# Patient Record
Sex: Male | Born: 1965 | Race: White | Hispanic: No | Marital: Single | Smoking: Never smoker
Health system: Southern US, Community
[De-identification: ages and names within clinical notes are randomized; demographics above are authoritative.]

## PROBLEM LIST (undated history)

## (undated) HISTORY — PX: HERNIA REPAIR: SHX51

---

## 2013-03-25 ENCOUNTER — Encounter (HOSPITAL_BASED_OUTPATIENT_CLINIC_OR_DEPARTMENT_OTHER): Payer: Self-pay

## 2013-03-25 ENCOUNTER — Emergency Department (HOSPITAL_BASED_OUTPATIENT_CLINIC_OR_DEPARTMENT_OTHER)
Admission: EM | Admit: 2013-03-25 | Discharge: 2013-03-25 | Disposition: A | Discharge status: Home / Self Care | Payer: Self-pay | Attending: Emergency Medicine | Admitting: Emergency Medicine

## 2013-03-25 ENCOUNTER — Emergency Department (HOSPITAL_BASED_OUTPATIENT_CLINIC_OR_DEPARTMENT_OTHER): Payer: Self-pay

## 2013-03-25 DIAGNOSIS — R0789 Other chest pain: Secondary | ICD-10-CM | POA: Insufficient documentation

## 2013-03-25 DIAGNOSIS — R5383 Other fatigue: Secondary | ICD-10-CM | POA: Insufficient documentation

## 2013-03-25 DIAGNOSIS — R079 Chest pain, unspecified: Secondary | ICD-10-CM

## 2013-03-25 DIAGNOSIS — R5381 Other malaise: Secondary | ICD-10-CM | POA: Insufficient documentation

## 2013-03-25 DIAGNOSIS — R002 Palpitations: Secondary | ICD-10-CM | POA: Insufficient documentation

## 2013-03-25 LAB — CBC
HCT: 44.4 % (ref 39.0–52.0)
Hemoglobin: 15.3 g/dL (ref 13.0–17.0)
MCV: 89.3 fL (ref 78.0–100.0)
RBC: 4.97 MIL/uL (ref 4.22–5.81)
RDW: 12.7 % (ref 11.5–15.5)
WBC: 7.5 10*3/uL (ref 4.0–10.5)

## 2013-03-25 LAB — BASIC METABOLIC PANEL
BUN: 19 mg/dL (ref 6–23)
CO2: 27 mEq/L (ref 19–32)
Chloride: 103 mEq/L (ref 96–112)
Creatinine, Ser: 1 mg/dL (ref 0.50–1.35)
Glucose, Bld: 95 mg/dL (ref 70–99)

## 2013-03-25 MED ORDER — ASPIRIN 81 MG PO CHEW
324.0000 mg | CHEWABLE_TABLET | Freq: Once | ORAL | Status: AC
  Administered 2013-03-25: 324 mg via ORAL
  Filled 2013-03-25: qty 4

## 2013-03-25 NOTE — ED Notes (Signed)
MD at bedside. 

## 2013-03-25 NOTE — ED Notes (Signed)
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  Reports 'tightness' in chest, mostly symptoms now resolved but still with small amount of pain.

## 2013-03-25 NOTE — ED Provider Notes (Signed)
CSN: [id number]     Arrival date & time 03/25/13  1806 History     First MD Initiated Contact with Patient 03/25/13 1901     Chief Complaint  Patient presents with  . Shortness of Breath  . Palpitations   (Consider location/radiation/quality/duration/timing/severity/associated sxs/prior Treatment) Patient is a 47 y.o. male presenting with chest pain. The history is provided by the patient.  Chest Pain Pain location:  Unable to specify Pain quality comment:  Unable to describe Pain radiates to:  Does not radiate Pain severity:  No pain Onset quality:  Sudden Duration:  4 hours Timing:  Constant Progression:  Resolved Chronicity:  New Context comment:  Cutting trees at work Relieved by:  Rest Worsened by:  Exertion Associated symptoms: fatigue and shortness of breath   Associated symptoms: no abdominal pain, no back pain, no fever, no lower extremity edema and no nausea     History reviewed. No pertinent past medical history. History reviewed. No pertinent past surgical history. No family history on file. History  Substance Use Topics  . Smoking status: Never Smoker   . Smokeless tobacco: Not on file  . Alcohol Use: Yes    Review of Systems  Constitutional: Positive for fatigue. Negative for fever.  Respiratory: Positive for shortness of breath.   Cardiovascular: Positive for chest pain. Negative for leg swelling.  Gastrointestinal: Negative for nausea, abdominal pain and diarrhea.  Musculoskeletal: Negative for back pain.  All other systems reviewed and are negative.    Allergies  Review of patient's allergies indicates no known allergies.  Home Medications  No current outpatient prescriptions on file. BP 131/80  Pulse 64  Temp(Src) 98.2 F (36.8 C) (Oral)  Resp 16  Ht 5\' 9"  (1.753 m)  Wt 205 lb (92.987 kg)  BMI 30.26 kg/m2  SpO2 100% Physical Exam  Nursing note and vitals reviewed. Constitutional: He is oriented to person, place, and time. He appears  well-developed and well-nourished.  HENT:  Head: Normocephalic and atraumatic.  Right Ear: External ear normal.  Left Ear: External ear normal.  Nose: Nose normal.  Eyes: Right eye exhibits no discharge. Left eye exhibits no discharge.  Neck: Neck supple.  Cardiovascular: Normal rate, regular rhythm, normal heart sounds and intact distal pulses.   Pulmonary/Chest: Effort normal and breath sounds normal. He exhibits no tenderness.  Abdominal: Soft. There is no tenderness.  Musculoskeletal: He exhibits no edema.  Neurological: He is alert and oriented to person, place, and time.  Skin: Skin is warm and dry.    ED Course   Procedures (including critical care time)  Labs Reviewed  BASIC METABOLIC PANEL - Abnormal; Notable for the following:    GFR calc non Af Amer 87 (*)    All other components within normal limits  CBC  TROPONIN I    Date: 03/25/2013  Rate: 63  Rhythm: normal sinus rhythm  QRS Axis: normal  Intervals: normal  ST/T Wave abnormalities: normal  Conduction Disutrbances:none  Narrative Interpretation: Normal EKG  Old EKG Reviewed: none available   No results found. 1. Chest pain     MDM  47 year old male with no prior medical history presents after having chest pain, palpitations, and shortness of breath while at work. The symptoms improved with rest and resumes when going back to work. He's been symptom-free for several hours. His EKG is benign. His labs are normal. Patient refused a chest x-ray as he is worried about the cost. I tried to discuss reasons for chest x-ray  and situation this am patient still refuses. He understands that the risks of misdiagnoses including, pneumothorax, pneumonia, widened mediastinum. He also does over save for a second troponin were developers protocol for a stress test. I feel that he is low risk however I discussed that his workup is not complete within not wanting to do these tests. He states that he understands the risks of going  home including a heart attack, worsening symptoms or death. He currently has passive make decisions and states he'll return if any symptoms get worse.   Audree Camel, MD 03/26/13 Marlyne Beards

## 2013-04-17 ENCOUNTER — Ambulatory Visit (INDEPENDENT_AMBULATORY_CARE_PROVIDER_SITE_OTHER): Payer: Self-pay | Admitting: General Surgery

## 2013-04-17 ENCOUNTER — Encounter (INDEPENDENT_AMBULATORY_CARE_PROVIDER_SITE_OTHER): Payer: Self-pay | Admitting: General Surgery

## 2013-04-17 ENCOUNTER — Telehealth (INDEPENDENT_AMBULATORY_CARE_PROVIDER_SITE_OTHER): Payer: Self-pay

## 2013-04-17 VITALS — BP 114/74 | HR 71 | Temp 97.0°F | Resp 16 | Ht 65.0 in | Wt 211.2 lb

## 2013-04-17 DIAGNOSIS — K436 Other and unspecified ventral hernia with obstruction, without gangrene: Secondary | ICD-10-CM

## 2013-04-17 DIAGNOSIS — K429 Umbilical hernia without obstruction or gangrene: Secondary | ICD-10-CM

## 2013-04-17 NOTE — Telephone Encounter (Signed)
Called and spoke to [facility] requesting medical records for patient appointment w/Dr. Biagio Quint.  Made Madelyn aware that patient is here in the office for appointment and we need records as soon as possible.  Advised that records will be faxed to [phone] attn: Neysa Bonito

## 2013-04-17 NOTE — Progress Notes (Signed)
Patient ID: David Taylor, male   DOB: 05/29/1966, 47 y.o.   MRN: [id number]  Chief Complaint  Patient presents with  . Abdominal Pain    Evaluate ventral hernia    HPI David Taylor is a 47 y.o. male.  This patient is referred by Dr. Mayford Taylor for evaluation ofa bulge in his abdomen that he noticed about 2-3 weeks ago. He says that he never noticed it prior to this.  He says he does have some discomfort with palpation but otherwise is does not bother him. He does say that it is not reducible and is not sure if this is increased in size. He denies any obstructive symptoms. 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 HPI  No past medical history on file. none No past surgical history on file. none No family history on file. none Social History History  Substance Use Topics  . Smoking status: Never Smoker   . Smokeless tobacco: Not on file  . Alcohol Use: Yes  no smoking, occ. alcohol  No Known Allergies  No current outpatient prescriptions on file.   No current facility-administered medications for this visit.  no meds  Review of Systems Review of Systems All other review of systems negative or noncontributory except as stated in the HPI  Blood pressure 114/74, pulse 71, temperature 97 F (36.1 C), temperature source Temporal, resp. rate 16, height 5\' 5"  (1.651 m), weight 211 lb 3.2 oz (95.8 kg).  Physical Exam Physical Exam Physical Exam  Vitals reviewed. Constitutional: He is oriented to person, place, and time. He appears well-developed and well-nourished. No distress.  HENT:  Head: Normocephalic and atraumatic.  Mouth/Throat: No oropharyngeal exudate.  Eyes: Conjunctivae and EOM are normal. Pupils are equal, round, and reactive to light. Right eye exhibits no discharge. Left eye exhibits no discharge. No scleral icterus.  Neck: Normal range of motion. No tracheal deviation present.  Cardiovascular: Normal  rate, regular rhythm and normal heart sounds.   Pulmonary/Chest: Effort normal and breath sounds normal. No stridor. No respiratory distress. He has no wheezes. He has no rales. He exhibits no tenderness.  Abdominal: Soft. Bowel sounds are normal. He exhibits no distension. There is no tenderness. There is no rebound and no guarding. he has a partially incarcerated epigastric hernia in the upper midline with a bulge just to the left of midline.  He also has a small but reducible umbilical hernia. Musculoskeletal: Normal range of motion. He exhibits no edema and no tenderness.  Neurological: He is alert and oriented to person, place, and time.  Skin: Skin is warm and dry. No rash noted. He is not diaphoretic. No erythema. No pallor.  Psychiatric: He has a normal mood and affect. His behavior is normal. Judgment and thought content normal.     Data Reviewed   Assessment    incarcerated epigastric hernia Reducible umbilical hernia  He has an incarcerated epigastric hernia as well as a small but reducible umbilical hernia on exam. I discussed with him the options of continued observation watchful waiting versus laparoscopic or open repair and he would like to proceed with open ventral and umbilical hernia repair with mesh. I did discuss with him the postoperative recovery and expectations and weight restrictions as well as the risks of the procedure. Discuss with him the risks of infection, bleeding, pain, scarring, recurrence, bowel injury, and persistent pain and symptoms and he expressed understanding  and desired to proceed with open repair of epigastric and umbilical hernias with possible mesh     Plan    We will plan for surgical repair        David Taylor DAVID 04/17/2013, 9:59 AM

## 2013-04-21 ENCOUNTER — Telehealth (INDEPENDENT_AMBULATORY_CARE_PROVIDER_SITE_OTHER): Payer: Self-pay | Admitting: General Surgery

## 2013-04-21 ENCOUNTER — Ambulatory Visit (INDEPENDENT_AMBULATORY_CARE_PROVIDER_SITE_OTHER): Payer: Self-pay | Admitting: Surgery

## 2013-04-21 NOTE — Telephone Encounter (Signed)
Spoke with pt about financial responsibilities pt will call back to schedule placed in pending folder

## 2013-06-05 ENCOUNTER — Telehealth (INDEPENDENT_AMBULATORY_CARE_PROVIDER_SITE_OTHER): Payer: Self-pay | Admitting: General Surgery

## 2013-06-05 NOTE — Telephone Encounter (Signed)
I called him to see how he was doing and to see what the status of scheduling surgery was.  He said that it does not feel any worse and that he is not having any nausea or vomiting and or obstructive symptoms.  It does not sound like he is having any obstruction or strangulation.  He is trying to work it out with his employer but it sounds like they are giving him a hard time.  I explained to him that I will be leaving in December but we would be happy to get him another surgeon to perform repair if he desires.  He will call us back or go to ER if increasing pain.

## 2014-04-24 ENCOUNTER — Emergency Department (HOSPITAL_BASED_OUTPATIENT_CLINIC_OR_DEPARTMENT_OTHER): Payer: Self-pay

## 2014-04-24 ENCOUNTER — Emergency Department (HOSPITAL_BASED_OUTPATIENT_CLINIC_OR_DEPARTMENT_OTHER)
Admission: EM | Admit: 2014-04-24 | Discharge: 2014-04-24 | Disposition: A | Discharge status: Home / Self Care | Payer: Self-pay | Attending: Emergency Medicine | Admitting: Emergency Medicine

## 2014-04-24 ENCOUNTER — Encounter (HOSPITAL_BASED_OUTPATIENT_CLINIC_OR_DEPARTMENT_OTHER): Payer: Self-pay | Admitting: Emergency Medicine

## 2014-04-24 DIAGNOSIS — K5900 Constipation, unspecified: Secondary | ICD-10-CM | POA: Insufficient documentation

## 2014-04-24 DIAGNOSIS — Z79899 Other long term (current) drug therapy: Secondary | ICD-10-CM | POA: Insufficient documentation

## 2014-04-24 DIAGNOSIS — N390 Urinary tract infection, site not specified: Secondary | ICD-10-CM | POA: Insufficient documentation

## 2014-04-24 LAB — URINALYSIS, ROUTINE W REFLEX MICROSCOPIC
GLUCOSE, UA: NEGATIVE mg/dL
Hgb urine dipstick: NEGATIVE
KETONES UR: 40 mg/dL — AB
NITRITE: NEGATIVE
PROTEIN: NEGATIVE mg/dL
Specific Gravity, Urine: 1.023 (ref 1.005–1.030)
UROBILINOGEN UA: 1 mg/dL (ref 0.0–1.0)
pH: 6 (ref 5.0–8.0)

## 2014-04-24 LAB — URINE MICROSCOPIC-ADD ON

## 2014-04-24 MED ORDER — POLYETHYLENE GLYCOL 3350 17 G PO PACK: 17.0000 g | PACK | Freq: Every day | ORAL | Status: DC

## 2014-04-24 MED ORDER — GI COCKTAIL ~~LOC~~
30.0000 mL | Freq: Once | ORAL | Status: AC
  Administered 2014-04-24: 30 mL via ORAL
  Filled 2014-04-24: qty 30

## 2014-04-24 MED ORDER — SULFAMETHOXAZOLE-TRIMETHOPRIM 800-160 MG PO TABS
1.0000 | ORAL_TABLET | Freq: Two times a day (BID) | ORAL | Status: DC
Start: 2014-04-24 — End: 2021-10-24

## 2014-04-24 NOTE — Discharge Instructions (Signed)
Urinary Tract Infection  Urinary tract infections (UTIs) can develop anywhere along your urinary tract. Your urinary tract is your body's drainage system for removing wastes and extra water. Your urinary tract includes two kidneys, two ureters, a bladder, and a urethra. Your kidneys are a pair of bean-shaped organs. Each kidney is about the size of your fist. They are located below your ribs, one on each side of your spine.  CAUSES  Infections are caused by microbes, which are microscopic organisms, including fungi, viruses, and bacteria. These organisms are so small that they can only be seen through a microscope. Bacteria are the microbes that most commonly cause UTIs.  SYMPTOMS   Symptoms of UTIs may vary by age and gender of the patient and by the location of the infection. Symptoms in young women typically include a frequent and intense urge to urinate and a painful, burning feeling in the bladder or urethra during urination. Older women and men are more likely to be tired, shaky, and weak and have muscle aches and abdominal pain. A fever may mean the infection is in your kidneys. Other symptoms of a kidney infection include pain in your back or sides below the ribs, nausea, and vomiting.  DIAGNOSIS  To diagnose a UTI, your caregiver will ask you about your symptoms. Your caregiver also will ask to provide a urine sample. The urine sample will be tested for bacteria and white blood cells. White blood cells are made by your body to help fight infection.  TREATMENT   Typically, UTIs can be treated with medication. Because most UTIs are caused by a bacterial infection, they usually can be treated with the use of antibiotics. The choice of antibiotic and length of treatment depend on your symptoms and the type of bacteria causing your infection.  HOME CARE INSTRUCTIONS  · If you were prescribed antibiotics, take them exactly as your caregiver instructs you. Finish the medication even if you feel better after  you have only taken some of the medication.  · Drink enough water and fluids to keep your urine clear or pale yellow.  · Avoid caffeine, tea, and carbonated beverages. They tend to irritate your bladder.  · Empty your bladder often. Avoid holding urine for long periods of time.  · Empty your bladder before and after sexual intercourse.  · After a bowel movement, women should cleanse from front to back. Use each tissue only once.  SEEK MEDICAL CARE IF:   · You have back pain.  · You develop a fever.  · Your symptoms do not begin to resolve within 3 days.  SEEK IMMEDIATE MEDICAL CARE IF:   · You have severe back pain or lower abdominal pain.  · You develop chills.  · You have nausea or vomiting.  · You have continued burning or discomfort with urination.  MAKE SURE YOU:   · Understand these instructions.  · Will watch your condition.  · Will get help right away if you are not doing well or get worse.  Document Released: 05/10/2005 Document Revised: 01/30/2012 Document Reviewed: 09/08/2011  ExitCare® Patient Information ©2015 [employer]. This information is not intended to replace advice given to you by your health care provider. Make sure you discuss any questions you have with your health care provider.  Constipation  Constipation is when a person has fewer than three bowel movements a week, has difficulty having a bowel movement, or has stools that are dry, hard, or larger than normal. As people grow   older, constipation is more common. If you try to fix constipation with medicines that make you have a bowel movement (laxatives), the problem may get worse. Long-term laxative use may cause the muscles of the colon to become weak. A low-fiber diet, not taking in enough fluids, and taking certain medicines may make constipation worse.   CAUSES   · Certain medicines, such as antidepressants, pain medicine, iron supplements, antacids, and water pills.    · Certain diseases, such as diabetes, irritable bowel syndrome  (IBS), thyroid disease, or depression.    · Not drinking enough water.    · Not eating enough fiber-rich foods.    · Stress or travel.    · Lack of physical activity or exercise.    · Ignoring the urge to have a bowel movement.    · Using laxatives too much.    SIGNS AND SYMPTOMS   · Having fewer than three bowel movements a week.    · Straining to have a bowel movement.    · Having stools that are hard, dry, or larger than normal.    · Feeling full or bloated.    · Pain in the lower abdomen.    · Not feeling relief after having a bowel movement.    DIAGNOSIS   Your health care provider will take a medical history and perform a physical exam. Further testing may be done for severe constipation. Some tests may include:  · A barium enema X-ray to examine your rectum, colon, and, sometimes, your small intestine.    · A sigmoidoscopy to examine your lower colon.    · A colonoscopy to examine your entire colon.  TREATMENT   Treatment will depend on the severity of your constipation and what is causing it. Some dietary treatments include drinking more fluids and eating more fiber-rich foods. Lifestyle treatments may include regular exercise. If these diet and lifestyle recommendations do not help, your health care provider may recommend taking over-the-counter laxative medicines to help you have bowel movements. Prescription medicines may be prescribed if over-the-counter medicines do not work.   HOME CARE INSTRUCTIONS   · Eat foods that have a lot of fiber, such as fruits, vegetables, whole grains, and beans.  · Limit foods high in fat and processed sugars, such as french fries, hamburgers, cookies, candies, and soda.    · A fiber supplement may be added to your diet if you cannot get enough fiber from foods.    · Drink enough fluids to keep your urine clear or pale yellow.    · Exercise regularly or as directed by your health care provider.    · Go to the restroom when you have the urge to go. Do not hold it.    · Only  take over-the-counter or prescription medicines as directed by your health care provider. Do not take other medicines for constipation without talking to your health care provider first.    SEEK IMMEDIATE MEDICAL CARE IF:   · You have bright red blood in your stool.    · Your constipation lasts for more than 4 days or gets worse.    · You have abdominal or rectal pain.    · You have thin, pencil-like stools.    · You have unexplained weight loss.  MAKE SURE YOU:   · Understand these instructions.  · Will watch your condition.  · Will get help right away if you are not doing well or get worse.  Document Released: 04/28/2004 Document Revised: 08/05/2013 Document Reviewed: 05/12/2013  ExitCare® Patient Information ©2015 [employer]. This information is not intended to replace advice given to you by   your health care provider. Make sure you discuss any questions you have with your health care provider.

## 2014-04-24 NOTE — ED Notes (Signed)
Pt returned from xray

## 2014-04-24 NOTE — ED Provider Notes (Signed)
CSN: [id number]     Arrival date & time 04/24/14  4098 History   First MD Initiated Contact with Patient 04/24/14 1022     Chief Complaint  Patient presents with  . Constipation    x 3 days     (Consider location/radiation/quality/duration/timing/severity/associated sxs/prior Treatment) HPI Comments: Patient reports 3 days of constipation not relieved by over-the-counter Dulcolax and Metamucil as well as lower abdominal pain described as feeling like the acid in the stomach is burning: The side of the stomach. He has had some anorexia. He denies fevers other than a subjective fever last night. He denies nausea vomiting, urinary symptoms. He has no past surgical or medical history. He states he thinks he has an abdominal hernia but does not have pain at site of his abdominal pain is constant, burning, without aggravating or alleviating factors.   Patient is a 48 y.o. male presenting with constipation.  Constipation Associated symptoms: abdominal pain   Associated symptoms: no back pain, no diarrhea, no dysuria, no fever, no nausea and no vomiting     History reviewed. No pertinent past medical history. History reviewed. No pertinent past surgical history. History reviewed. No pertinent family history. History  Substance Use Topics  . Smoking status: Never Smoker   . Smokeless tobacco: Not on file  . Alcohol Use: Yes    Review of Systems  Constitutional: Negative for fever, activity change, appetite change and fatigue.  HENT: Negative for congestion, facial swelling, rhinorrhea and trouble swallowing.   Eyes: Negative for photophobia and pain.  Respiratory: Negative for cough, chest tightness and shortness of breath.   Cardiovascular: Negative for chest pain and leg swelling.  Gastrointestinal: Positive for abdominal pain and constipation. Negative for nausea, vomiting and diarrhea.  Endocrine: Negative for polydipsia and polyuria.  Genitourinary: Negative for dysuria, urgency,  decreased urine volume and difficulty urinating.  Musculoskeletal: Negative for back pain and gait problem.  Skin: Negative for color change, rash and wound.  Allergic/Immunologic: Negative for immunocompromised state.  Neurological: Negative for dizziness, facial asymmetry, speech difficulty, weakness, numbness and headaches.  Psychiatric/Behavioral: Negative for confusion, decreased concentration and agitation.      Allergies  Review of patient's allergies indicates no known allergies.  Home Medications   Prior to Admission medications   Medication Sig Start Date End Date Taking? Authorizing Provider  polyethylene glycol (MIRALAX / GLYCOLAX) packet Take 17 g by mouth daily. May take up to 3 capfuls in a large Gatorade over the course of one day. You may due this daily until your stool is loose, then decrease to 1 capful a day for 1 week. 04/24/14   Toy Cookey, MD  sulfamethoxazole-trimethoprim (SEPTRA DS) 800-160 MG per tablet Take 1 tablet by mouth every 12 (twelve) hours. 04/24/14   Toy Cookey, MD   BP 135/86  Pulse 65  Temp(Src) 98.7 F (37.1 C) (Oral)  Resp 18  Ht  (1.753 m)  Wt 205 lb (92.987 kg)  BMI 30.26 kg/m2  SpO2 100% Physical Exam  Constitutional: He is oriented to person, place, and time. He appears well-developed and well-nourished. No distress.  HENT:  Head: Normocephalic and atraumatic.  Mouth/Throat: No oropharyngeal exudate.  Eyes: Pupils are equal, round, and reactive to light.  Neck: Normal range of motion. Neck supple.  Cardiovascular: Normal rate, regular rhythm and normal heart sounds.  Exam reveals no gallop and no friction rub.   No murmur heard. Pulmonary/Chest: Effort normal and breath sounds normal. No respiratory distress. He has no  wheezes. He has no rales.  Abdominal: Soft. Bowel sounds are normal. He exhibits no distension and no mass. There is tenderness in the suprapubic area. There is no rebound and no guarding.  Musculoskeletal:  Normal range of motion. He exhibits no edema and no tenderness.  Neurological: He is alert and oriented to person, place, and time.  Skin: Skin is warm and dry.  Psychiatric: He has a normal mood and affect.    ED Course  Procedures (including critical care time) Labs Review Labs Reviewed  URINALYSIS, ROUTINE W REFLEX MICROSCOPIC - Abnormal; Notable for the following:    Color, Urine AMBER (*)    Bilirubin Urine SMALL (*)    Ketones, ur 40 (*)    Leukocytes, UA TRACE (*)    All other components within normal limits  URINE MICROSCOPIC-ADD ON - Abnormal; Notable for the following:    Bacteria, UA MANY (*)    All other components within normal limits  URINE CULTURE    Imaging Review Dg Abd Acute W/chest  04/24/2014   CLINICAL DATA:  Lower abdominal pain  EXAM: ACUTE ABDOMEN SERIES (ABDOMEN 2 VIEW & CHEST 1 VIEW)  COMPARISON:  None.  FINDINGS: There is no evidence of dilated bowel loops or free intraperitoneal air. No radiopaque calculi or other significant radiographic abnormality is seen. Heart size and mediastinal contours are within normal limits. Both lungs are clear.  IMPRESSION: No acute abnormality noted.   Electronically Signed   By: Alcide Clever M.D.   On: 04/24/2014 11:23     EKG Interpretation None      MDM   Final diagnoses:  Constipation, unspecified constipation type  UTI (lower urinary tract infection)   Pt is a 48 y.o. male with Pmhx as above who presents with constipation burning lower abdominal pain that he states feels like he has too much acid in his stomach. He states he feels like he had a fever last night but did not take his temperature. His knuckle he had fever today. He has had anorexia over the last several days, but no nausea no vomiting no urinary symptoms. He does not have prior surgical history. He also does not have a history of constipation. He tried over-the-counter Dulcolax and Metamucil without relief. On exam vital signs are stable and patient  is in no acute distress. He has some suprapubic abdominal tenderness on physical exam without rebound or guarding. Bowel sounds are normal. Acute abdominal series nml. UA c/w early UTI with WBC, leukocytes and mucus present.  Will start on bactrim and also rec miralax for constipation. Return precautions given for new or worsening symptoms including worsening pain, fever, inability to tolerate PO.  Toy Cookey, MD 04/24/14 2000

## 2014-04-25 LAB — URINE CULTURE: Colony Count: 3000

## 2014-11-17 ENCOUNTER — Emergency Department (HOSPITAL_BASED_OUTPATIENT_CLINIC_OR_DEPARTMENT_OTHER)
Admission: EM | Admit: 2014-11-17 | Discharge: 2014-11-17 | Disposition: A | Discharge status: Home / Self Care | Payer: Self-pay | Attending: Emergency Medicine | Admitting: Emergency Medicine

## 2014-11-17 ENCOUNTER — Encounter (HOSPITAL_BASED_OUTPATIENT_CLINIC_OR_DEPARTMENT_OTHER): Payer: Self-pay

## 2014-11-17 DIAGNOSIS — Z79899 Other long term (current) drug therapy: Secondary | ICD-10-CM | POA: Insufficient documentation

## 2014-11-17 DIAGNOSIS — L259 Unspecified contact dermatitis, unspecified cause: Secondary | ICD-10-CM | POA: Insufficient documentation

## 2014-11-17 MED ORDER — PREDNISONE 50 MG PO TABS
60.0000 mg | ORAL_TABLET | Freq: Once | ORAL | Status: AC
  Administered 2014-11-17: 60 mg via ORAL
  Filled 2014-11-17 (×2): qty 1

## 2014-11-17 MED ORDER — PREDNISONE (PAK) 10 MG PO TABS: ORAL_TABLET | ORAL | Status: DC

## 2014-11-17 NOTE — ED Provider Notes (Signed)
CSN: [id number]     Arrival date & time 11/17/14  0741 History   First MD Initiated Contact with Patient 11/17/14 [phone]     Chief Complaint  Patient presents with  . Rash    HPI Patient presents to the emergency room with complaints of a rash to his legs arm face and back for the last several days. Patient was exposed to poison ivy. He developed the rash after this exposure. Patient has a history of having severe reactions to poison ivy.  He was always given a cortisone shot in the past with good resolution. And went to an urgent care yesterday. He was given an injection of Solu-Medrol 120 mg IM. Patient has not noticed any significant improvement. He thinks he may have been given the wrong injection. The patient did bring the paperwork with him that does document the Solu-Medrol injection. Denies any trouble with his breathing. He denies any ears or chills. Rashes diffuse and now also involves his face. History reviewed. No pertinent past medical history. History reviewed. No pertinent past surgical history. No family history on file. History  Substance Use Topics  . Smoking status: Never Smoker   . Smokeless tobacco: Not on file  . Alcohol Use: Yes    Review of Systems  All other systems reviewed and are negative.     Allergies  Review of patient's allergies indicates no known allergies.  Home Medications   Prior to Admission medications   Medication Sig Start Date End Date Taking? Authorizing Provider  polyethylene glycol (MIRALAX / GLYCOLAX) packet Take 17 g by mouth daily. May take up to 3 capfuls in a large Gatorade over the course of one day. You may due this daily until your stool is loose, then decrease to 1 capful a day for 1 week. 04/24/14   Toy CookeyMegan Docherty, MD  predniSONE (STERAPRED UNI-PAK) 10 MG tablet Take 6 tabs by mouth daily  for 2 days, then 5 tabs for 2 days, then 4 tabs for 2 days, then 3 tabs for 2 days, 2 tabs for 2 days, then 1 tab by mouth daily for 2 days 11/17/14    Linwood DibblesJon Jazmynn Pho, MD  sulfamethoxazole-trimethoprim (SEPTRA DS) 800-160 MG per tablet Take 1 tablet by mouth every 12 (twelve) hours. 04/24/14   Toy CookeyMegan Docherty, MD   BP 125/83 mmHg  Pulse 72  Temp(Src) 97.8 F (36.6 C) (Oral)  Ht 5\' 9"  (1.753 m)  Wt 215 lb (97.523 kg)  BMI 31.74 kg/m2  SpO2 98% Physical Exam  Constitutional: He appears well-developed and well-nourished. No distress.  HENT:  Head: Normocephalic and atraumatic.  Right Ear: External ear normal.  Left Ear: External ear normal.  Mouth/Throat: No oropharyngeal exudate.  Eyes: Conjunctivae are normal. Right eye exhibits no discharge. Left eye exhibits no discharge. No scleral icterus.  Neck: Neck supple. No tracheal deviation present.  Cardiovascular: Normal rate.   Pulmonary/Chest: Effort normal. No stridor. No respiratory distress. He has no wheezes.  Musculoskeletal: He exhibits no edema.  Neurological: He is alert. Cranial nerve deficit: no gross deficits.  Skin: Skin is warm and dry. Rash noted. No purpura noted. Rash is maculopapular. Rash is not pustular and not urticarial. There is erythema.  Psychiatric: He has a normal mood and affect.  Nursing note and vitals reviewed.   ED Course  Procedures (including critical care time) Labs Review Labs Reviewed - No data to display  Imaging Review No results found.   EKG Interpretation None  MDM   Final diagnoses:  Contact dermatitis   Rashes consistent with a contact dermatitis. He was given an appropriate injection of steroids yesterday. I will add on a prednisone taper for him to continue. I also suggested he take an antihistamine around-the-clock.  Consider seeing a dermatologist if the symptoms do not improve    Linwood Dibbles, MD 11/17/14 [phone]

## 2014-11-17 NOTE — ED Notes (Signed)
Pt was given Solumedrol injection yesterday at the office.

## 2014-11-17 NOTE — ED Notes (Signed)
Pt reports poison ivy rash to skin. Rash to legs, arm, face, back. Went to UC yesterday and was given "the wrong thing."

## 2014-11-17 NOTE — Discharge Instructions (Signed)

## 2015-02-06 IMAGING — CR DG ABDOMEN ACUTE W/ 1V CHEST
4 series · 4 of 4 positions shown · non-contrast
Comparison: None.

CLINICAL DATA: Lower abdominal pain

EXAM:
ACUTE ABDOMEN SERIES (ABDOMEN 2 VIEW & CHEST 1 VIEW)

[w chest pa]
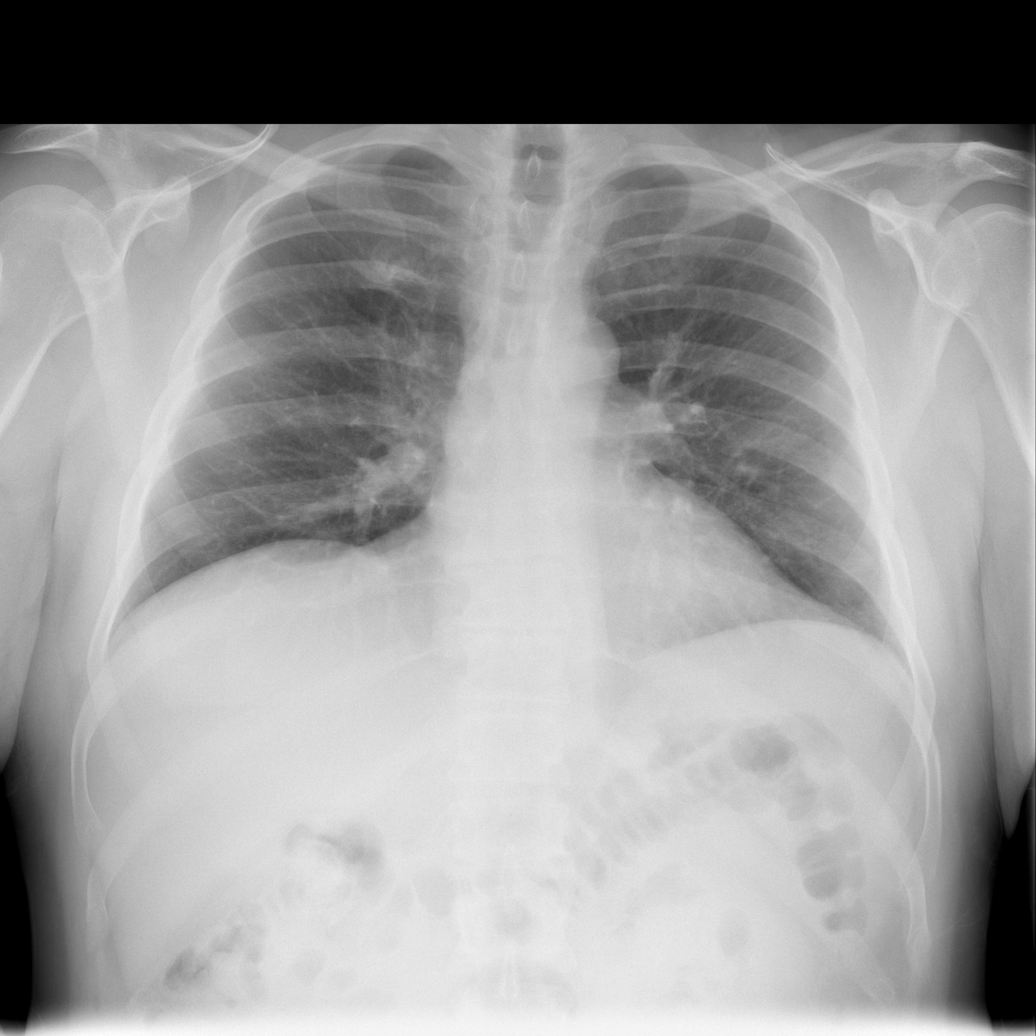

[w abdomen upright]
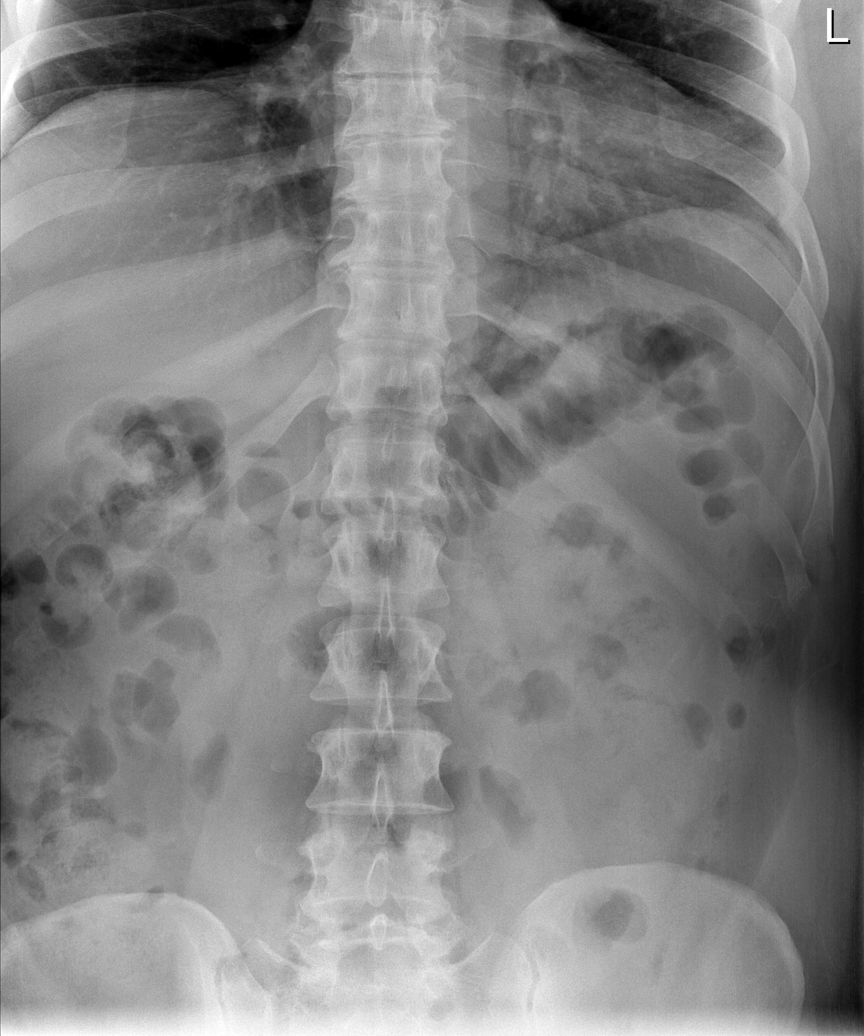

[t abdomen supine (1 of 2)]
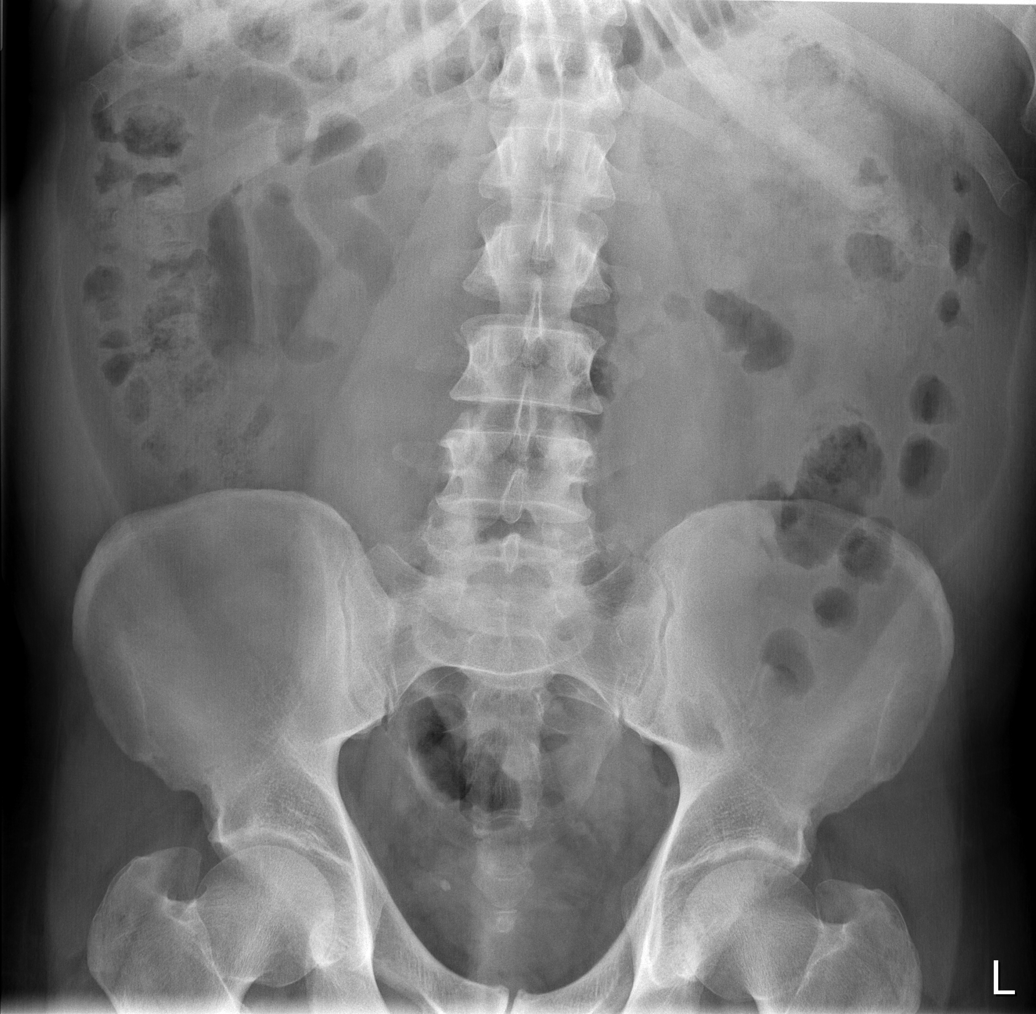

[t abdomen supine (2 of 2)]
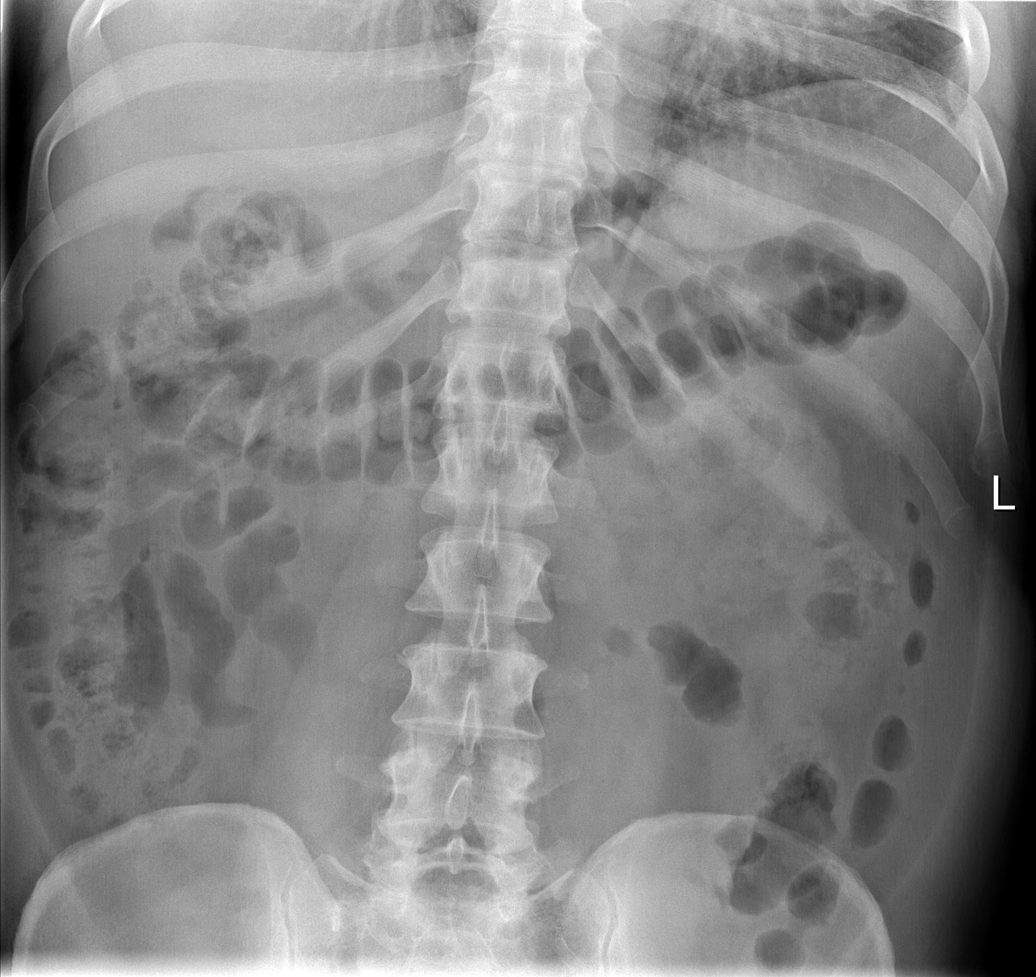

[4 of 4 positions shown; findings below may reference images not displayed]

FINDINGS: There is no evidence of dilated bowel loops or free intraperitoneal
air. No radiopaque calculi or other significant radiographic
abnormality is seen. Heart size and mediastinal contours are within
normal limits. Both lungs are clear.
IMPRESSION: No acute abnormality noted.

## 2017-08-10 ENCOUNTER — Emergency Department (HOSPITAL_COMMUNITY)
Admission: EM | Admit: 2017-08-10 | Discharge: 2017-08-10 | Disposition: A | Discharge status: Home / Self Care | Payer: Self-pay | Attending: Emergency Medicine | Admitting: Emergency Medicine

## 2017-08-10 ENCOUNTER — Encounter (HOSPITAL_COMMUNITY): Payer: Self-pay | Admitting: Emergency Medicine

## 2017-08-10 ENCOUNTER — Other Ambulatory Visit: Payer: Self-pay

## 2017-08-10 DIAGNOSIS — K5900 Constipation, unspecified: Secondary | ICD-10-CM | POA: Insufficient documentation

## 2017-08-10 DIAGNOSIS — N342 Other urethritis: Secondary | ICD-10-CM

## 2017-08-10 DIAGNOSIS — N341 Nonspecific urethritis: Secondary | ICD-10-CM | POA: Insufficient documentation

## 2017-08-10 LAB — URINALYSIS, ROUTINE W REFLEX MICROSCOPIC
Bilirubin Urine: NEGATIVE
Glucose, UA: NEGATIVE mg/dL
KETONES UR: 5 mg/dL — AB
Nitrite: NEGATIVE
PROTEIN: 30 mg/dL — AB
Specific Gravity, Urine: 1.021 (ref 1.005–1.030)
pH: 5 (ref 5.0–8.0)

## 2017-08-10 LAB — POC OCCULT BLOOD, ED: Fecal Occult Bld: NEGATIVE

## 2017-08-10 MED ORDER — CEFTRIAXONE SODIUM 250 MG IJ SOLR
250.0000 mg | Freq: Once | INTRAMUSCULAR | Status: AC
  Administered 2017-08-10: 250 mg via INTRAMUSCULAR
  Filled 2017-08-10: qty 250

## 2017-08-10 MED ORDER — AZITHROMYCIN 1 G PO PACK
1.0000 g | PACK | Freq: Once | ORAL | Status: AC
  Administered 2017-08-10: 1 g via ORAL
  Filled 2017-08-10: qty 1

## 2017-08-10 MED ORDER — LIDOCAINE HCL (PF) 1 % IJ SOLN
INTRAMUSCULAR | Status: AC
  Administered 2017-08-10: 5 mL
  Filled 2017-08-10: qty 5

## 2017-08-10 NOTE — ED Notes (Signed)
Unable to obtain signature, signature pad not working.

## 2017-08-10 NOTE — ED Provider Notes (Signed)
[facility]-EMERGENCY DEPT Provider Note   CSN: [id number] Arrival date & time: 08/10/17  [id number]     History   Chief Complaint Chief Complaint  Patient presents with  . Abdominal Pain  . Constipation  . penile swelling  . Dysuria    HPI David Taylor is a 51 y.o. male.  Patient reports feeling constipated last bowel movement was 2 days ago.  He treated himself with milk of magnesia 1 dose without relief.  He also complains of burning on urination at penis for the past few days.  He denies fever he denies any abdominal pain denies sense of urgency no vomiting or nausea no other associated symptoms.  He is concerned that his ventral hernia which she has had for many years may be the cause of his symptoms.  HPI  History reviewed. No pertinent past medical history.  There are no active problems to display for this patient.   History reviewed. No pertinent surgical history.     Home Medications    Prior to Admission medications   Medication Sig Start Date End Date Taking? Authorizing Provider  ibuprofen (ADVIL,MOTRIN) 200 MG tablet Take 400 mg by mouth 2 (two) times daily as needed (PAIN).   Yes [provider]  Multiple Vitamin (MULTIVITAMIN WITH MINERALS) TABS tablet Take 1 tablet by mouth daily.   Yes [provider]  oxymetazoline (AFRIN) 0.05 % nasal spray Place 2-3 sprays into both nostrils at bedtime as needed for congestion.   Yes [provider]  polyethylene glycol (MIRALAX / GLYCOLAX) packet Take 17 g by mouth daily. May take up to 3 capfuls in a large Gatorade over the course of one day. You may due this daily until your stool is loose, then decrease to 1 capful a day for 1 week. Patient not taking: Reported on 08/10/2017 04/24/14   Toy Cookeyocherty, Megan, MD  predniSONE (STERAPRED UNI-PAK) 10 MG tablet Take 6 tabs by mouth daily  for 2 days, then 5 tabs for 2 days, then 4 tabs for 2 days, then 3 tabs for 2 days, 2 tabs for 2 days,  then 1 tab by mouth daily for 2 days Patient not taking: Reported on 08/10/2017 11/17/14   Linwood DibblesKnapp, Jon, MD  sulfamethoxazole-trimethoprim (SEPTRA DS) 800-160 MG per tablet Take 1 tablet by mouth every 12 (twelve) hours. Patient not taking: Reported on 08/10/2017 04/24/14   Toy Cookeyocherty, Megan, MD    Family History History reviewed. No pertinent family history.  Social History Social History   Tobacco Use  . Smoking status: Never Smoker  . Smokeless tobacco: Never Used  Substance Use Topics  . Alcohol use: Yes  . Drug use: No     Allergies   Patient has no known allergies.   Review of Systems Review of Systems   Physical Exam Updated Vital Signs BP 119/87 (BP Location: Right Arm)   Pulse 70   Temp 98.3 F (36.8 C)   Resp 18   SpO2 98%   Physical Exam  Constitutional: He appears well-developed and well-nourished.  HENT:  Head: Normocephalic and atraumatic.  Eyes: Conjunctivae are normal. Pupils are equal, round, and reactive to light.  Neck: Neck supple. No tracheal deviation present. No thyromegaly present.  Cardiovascular: Normal rate and regular rhythm.  No murmur heard. Pulmonary/Chest: Effort normal and breath sounds normal.  Abdominal: Soft. Bowel sounds are normal. He exhibits no distension. There is no tenderness.  There is a ventral hernia which reduces spontaneously when patient lies supine.  Hernias not red warm or tender  Genitourinary: Penis normal.  Genitourinary Comments: Penis is not swollen, circumcised.  No lesion.  Scrotum normal.  Rectum normal tone soft brown stool Hemoccult negative.  Musculoskeletal: Normal range of motion. He exhibits no edema or tenderness.  Neurological: He is alert. Coordination normal.  Skin: Skin is warm and dry. No rash noted.  Psychiatric: He has a normal mood and affect.  Nursing note and vitals reviewed.    ED Treatments / Results  Labs (all labs ordered are listed, but only abnormal results are displayed) Labs  Reviewed  RPR  HIV ANTIBODY (ROUTINE TESTING)  URINALYSIS, ROUTINE W REFLEX MICROSCOPIC  POC OCCULT BLOOD, ED  POC OCCULT BLOOD, ED  GC/CHLAMYDIA PROBE AMP ([place]) NOT AT [facility]    EKG  EKG Interpretation None       Radiology No results found.  Procedures Procedures (including critical care time)  Medications Ordered in ED Medications - No data to display   Initial Impression / Assessment and Plan / ED Course  I have reviewed the triage vital signs and the nursing notes.  Pertinent labs & imaging results that were available during my care of the patient were reviewed by me and considered in my medical decision making (see chart for details).     1:50 PM patient resting comfortably no distress.  Plan Rocephin, Zithromax, safe sex encouraged.  MiraLAX.  Referral primary care referral [facility]. Urine culture STD panel pending Final Clinical Impressions(s) / ED Diagnoses  Diagnoses #1 urethritis #2 constipation Final diagnoses:  None    ED Discharge Orders    None       Doug SouJacubowitz, Gabriell Casimir, MD 08/10/17 1356

## 2017-08-10 NOTE — ED Triage Notes (Signed)
Pt c/o constipation, states last bowel movement was days ago, pt states he also is having swelling in the penis and difficulty urinating. Pt c/o pain with urination. Pt states he has been using laxatives.

## 2017-08-10 NOTE — Discharge Instructions (Signed)
Take MiraLAX as directed for constipation.  Use a condom each time that you have sex.  Call [facility] to schedule appointment if not feeling better by next week.  Call the number on these discharge instructions to get a primary care physician ask your primary care physician to order a colonoscopy for you

## 2017-08-11 LAB — URINE CULTURE
CULTURE: NO GROWTH
SPECIAL REQUESTS: NORMAL

## 2017-08-11 LAB — RPR: RPR: NONREACTIVE

## 2017-08-11 LAB — HIV ANTIBODY (ROUTINE TESTING W REFLEX): HIV Screen 4th Generation wRfx: NONREACTIVE

## 2017-08-13 LAB — GC/CHLAMYDIA PROBE AMP (~~LOC~~) NOT AT ARMC
CHLAMYDIA, DNA PROBE: NEGATIVE
NEISSERIA GONORRHEA: POSITIVE — AB

## 2021-07-19 ENCOUNTER — Other Ambulatory Visit: Payer: Self-pay

## 2021-07-19 ENCOUNTER — Encounter (INDEPENDENT_AMBULATORY_CARE_PROVIDER_SITE_OTHER): Payer: Self-pay

## 2021-07-19 ENCOUNTER — Ambulatory Visit (INDEPENDENT_AMBULATORY_CARE_PROVIDER_SITE_OTHER): Payer: BC Managed Care – PPO | Admitting: Podiatry

## 2021-07-19 DIAGNOSIS — B353 Tinea pedis: Secondary | ICD-10-CM

## 2021-07-19 MED ORDER — CLOTRIMAZOLE-BETAMETHASONE 1-0.05 % EX CREA: 1.0000 "application " | TOPICAL_CREAM | Freq: Two times a day (BID) | CUTANEOUS | 3 refills | Status: DC

## 2021-07-19 NOTE — Progress Notes (Signed)
   HPI: 55 y.o. male presenting today for evaluation of intermittent flareups of athlete's foot.  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  He experiences intermittent flareups of burning sensation with itching to the bilateral feet with skin cracking.  Currently it is asymptomatic but he says that about 2 weeks ago he had an acute flareup.  He presents for further treatment and evaluation  No past medical history on file.   Physical Exam: General: The patient is alert and oriented x3 in no acute distress.  Dermatology: Skin is warm, dry and supple bilateral lower extremities. Negative for open lesions or macerations.  There is some diffuse light hyperkeratosis of skin with some skin peeling to the interdigital areas.  Vascular: Palpable pedal pulses bilaterally. No edema or erythema noted. Capillary refill within normal limits.  Neurological: Epicritic and protective threshold grossly intact bilaterally.   Musculoskeletal Exam: No pedal deformities noted  Assessment: 1.  Intermittent flareups of athlete's foot/tinea pedis bilateral   Plan of Care:  1. Patient evaluated.  2.  Prescription for Lotrisone cream applied daily as needed 3.  Recommend OTC antifungal foot spray daily as a preventative 4.  Return to clinic as needed      Felecia Shelling, DPM [facility]  Dr. Felecia Shelling, DPM    [address] [place], [place] [address]                Office [phone]  Fax [phone]

## 2021-10-24 NOTE — Progress Notes (Unsigned)
° °  There were no vitals taken for this visit.   Subjective:    Patient ID: David Taylor, male    DOB: 1966/04/29, 56 y.o.   MRN: [id number]  HPI: David Taylor is a 56 y.o. male  No chief complaint on file.   Patient presents to clinic to establish care with new PCP.  Introduced to Designer, jewellery role and practice setting.  All questions answered.  Discussed provider/patient relationship and expectations.  Patient reports a history of ***. Patient denies a history of: Hypertension, Elevated Cholesterol, Diabetes, Thyroid problems, Depression, Anxiety, Neurological problems, and Abdominal problems.   Active Ambulatory Problems    Diagnosis Date Noted   No Active Ambulatory Problems   Resolved Ambulatory Problems    Diagnosis Date Noted   No Resolved Ambulatory Problems   No Additional Past Medical History   No past surgical history on file. No family history on file.   Review of Systems  Per HPI unless specifically indicated above     Objective:    There were no vitals taken for this visit.  Wt Readings from Last 3 Encounters:  11/17/14 215 lb (97.5 kg)  04/24/14 205 lb (93 kg)  04/17/13 211 lb 3.2 oz (95.8 kg)    Physical Exam  Results for orders placed or performed during the hospital encounter of 08/10/17  Urine Culture   Specimen: Urine, Random  Result Value Ref Range   Specimen Description URINE, RANDOM    Special Requests Normal    Culture      NO GROWTH Performed at [facility], [place] [address], [place], [place] [address]    Report Status 08/11/2017 FINAL   RPR  Result Value Ref Range   RPR Ser Ql Non Reactive Non Reactive  HIV antibody  Result Value Ref Range   HIV Screen 4th Generation wRfx Non Reactive Non Reactive  Urinalysis, Routine w reflex microscopic  Result Value Ref Range   Color, Urine YELLOW YELLOW   APPearance CLOUDY (A) CLEAR   Specific Gravity, Urine 1.021 1.005 - 1.030   pH 5.0 5.0 - 8.0   Glucose, UA NEGATIVE NEGATIVE  mg/dL   Hgb urine dipstick SMALL (A) NEGATIVE   Bilirubin Urine NEGATIVE NEGATIVE   Ketones, ur 5 (A) NEGATIVE mg/dL   Protein, ur 30 (A) NEGATIVE mg/dL   Nitrite NEGATIVE NEGATIVE   Leukocytes, UA LARGE (A) NEGATIVE   RBC / HPF 6-30 0 - 5 RBC/hpf   WBC, UA TOO NUMEROUS TO COUNT 0 - 5 WBC/hpf   Bacteria, UA RARE (A) NONE SEEN   Squamous Epithelial / LPF 0-5 (A) NONE SEEN   WBC Clumps PRESENT    Mucus PRESENT   POC occult blood, ED  Result Value Ref Range   Fecal Occult Bld NEGATIVE NEGATIVE  GC/Chlamydia probe amp ([place])not at [facility]  Result Value Ref Range   Chlamydia Negative    Neisseria Gonorrhea **POSITIVE** (A)       Assessment & Plan:   Problem List Items Addressed This Visit   None Visit Diagnoses     Encounter to establish care    -  Primary        Follow up plan: No follow-ups on file.

## 2021-10-25 ENCOUNTER — Other Ambulatory Visit: Payer: Self-pay

## 2021-10-25 ENCOUNTER — Ambulatory Visit (INDEPENDENT_AMBULATORY_CARE_PROVIDER_SITE_OTHER): Payer: BC Managed Care – PPO | Admitting: Nurse Practitioner

## 2021-10-25 ENCOUNTER — Encounter: Payer: Self-pay | Admitting: Nurse Practitioner

## 2021-10-25 VITALS — BP 124/74 | HR 66 | Temp 98.4°F | Ht 67.0 in | Wt 222.6 lb

## 2021-10-25 DIAGNOSIS — E669 Obesity, unspecified: Secondary | ICD-10-CM

## 2021-10-25 DIAGNOSIS — Z7689 Persons encountering health services in other specified circumstances: Secondary | ICD-10-CM | POA: Diagnosis not present

## 2021-10-25 DIAGNOSIS — L750 Bromhidrosis: Secondary | ICD-10-CM

## 2021-11-21 ENCOUNTER — Telehealth: Payer: Self-pay | Admitting: Nurse Practitioner

## 2021-11-21 NOTE — Telephone Encounter (Signed)
Copied from [place] [id number]. Topic: Referral - Request for Referral ?>> Nov 21, 2021  8:02 AM Oneta Rack wrote: ?Caller states she believes patient discussed at [facility] on 10/25/2021 ear pain in both ears. Ear pain has not improved and caller would like PCP to place a referral with a ENT.Caller would like a follow up regarding when the referral has been placed. ?

## 2021-11-21 NOTE — Telephone Encounter (Signed)
Reviewed patient's chart. Do not see documentation in most recent OV note regarding ear pain.  ? ?Please call and schedule an appointment for discussion of ear pain.  ?

## 2021-11-21 NOTE — Telephone Encounter (Signed)
Pt scheduled for appointment on 4/24. He could discuss then, Karen's schedule is booked out until May ?

## 2021-11-21 NOTE — Telephone Encounter (Signed)
Spoke with pts wife (On DPR) states that she will try to find an ENT doctor on her own because they don't need a referral with their insurance. She states that if she does not have any luck she will give a call back. ?

## 2021-11-24 DIAGNOSIS — C44519 Basal cell carcinoma of skin of other part of trunk: Secondary | ICD-10-CM | POA: Diagnosis not present

## 2021-11-24 DIAGNOSIS — D2261 Melanocytic nevi of right upper limb, including shoulder: Secondary | ICD-10-CM | POA: Diagnosis not present

## 2021-11-24 DIAGNOSIS — D485 Neoplasm of uncertain behavior of skin: Secondary | ICD-10-CM | POA: Diagnosis not present

## 2021-11-24 DIAGNOSIS — D2262 Melanocytic nevi of left upper limb, including shoulder: Secondary | ICD-10-CM | POA: Diagnosis not present

## 2021-11-24 DIAGNOSIS — D225 Melanocytic nevi of trunk: Secondary | ICD-10-CM | POA: Diagnosis not present

## 2021-11-24 DIAGNOSIS — L309 Dermatitis, unspecified: Secondary | ICD-10-CM | POA: Diagnosis not present

## 2021-12-02 NOTE — Progress Notes (Signed)
? ?BP 123/78   Pulse (!) 54   Temp 98.5 ?F (36.9 ?C) (Oral)   Ht 5' 7.4" (1.712 m)   Wt 225 lb 3.2 oz (102.2 kg)   SpO2 97%   BMI 34.85 kg/m?   ? ?Subjective:  ? ? Patient ID: Arlis Yale, male    DOB: 05-04-1966, 56 y.o.   MRN: [id number] ? ?HPI: ?Eliodoro Gullett is a 56 y.o. male presenting on 12/05/2021 for comprehensive medical examination. Current medical complaints include:none ? ?He currently lives with: ?Interim Problems from his last visit: no ? ? Denies HA, CP, SOB, dizziness, palpitations, visual changes, and lower extremity swelling. ? ?Depression Screen done today and results listed below:  ? ?  12/05/2021  ? 10:18 AM  ?Depression screen PHQ 2/9  ?Decreased Interest 0  ?Down, Depressed, Hopeless 0  ?PHQ - 2 Score 0  ?Altered sleeping 0  ?Tired, decreased energy 0  ?Change in appetite 1  ?Feeling bad or failure about yourself  0  ?Trouble concentrating 0  ?Moving slowly or fidgety/restless 0  ?Suicidal thoughts 0  ?PHQ-9 Score 1  ?Difficult doing work/chores Not difficult at all  ? ? ?The patient does not have a history of falls. I did complete a risk assessment for falls. A plan of care for falls was documented. ? ? ?Past Medical History:  ?History reviewed. No pertinent past medical history. ? ?Surgical History:  ?Past Surgical History:  ?Procedure Laterality Date  ? HERNIA REPAIR    ? ? ?Medications:  ?No current outpatient medications on file prior to visit.  ? ?No current facility-administered medications on file prior to visit.  ? ? ?Allergies:  ?Allergies  ?Allergen Reactions  ? Poison Ivy Extract Rash  ? ? ?Social History:  ?Social History  ? ?Socioeconomic History  ? Marital status: Single  ?  Spouse name: Not on file  ? Number of children: Not on file  ? Years of education: Not on file  ? Highest education level: Not on file  ?Occupational History  ? Not on file  ?Tobacco Use  ? Smoking status: Never  ? Smokeless tobacco: Never  ?Vaping Use  ? Vaping Use: Never used  ?Substance and Sexual  Activity  ? Alcohol use: Not Currently  ? Drug use: No  ? Sexual activity: Yes  ?Other Topics Concern  ? Not on file  ?Social History Narrative  ? Not on file  ? ?Social Determinants of Health  ? ?Financial Resource Strain: Not on file  ?Food Insecurity: Not on file  ?Transportation Needs: Not on file  ?Physical Activity: Not on file  ?Stress: Not on file  ?Social Connections: Not on file  ?Intimate Partner Violence: Not on file  ? ?Social History  ? ?Tobacco Use  ?Smoking Status Never  ?Smokeless Tobacco Never  ? ?Social History  ? ?Substance and Sexual Activity  ?Alcohol Use Not Currently  ? ? ?Family History:  ?History reviewed. No pertinent family history. ? ?Past medical history, surgical history, medications, allergies, family history and social history reviewed with patient today and changes made to appropriate areas of the chart.  ? ?Review of Systems  ?Eyes:  Negative for blurred vision and double vision.  ?Respiratory:  Negative for shortness of breath.   ?Cardiovascular:  Negative for chest pain, palpitations and leg swelling.  ?Neurological:  Negative for dizziness and headaches.  ?All other ROS negative except what is listed above and in the HPI.  ? ?   ?Objective:  ?  ?BP  123/78   Pulse (!) 54   Temp 98.5 ?F (36.9 ?C) (Oral)   Ht 5' 7.4" (1.712 m)   Wt 225 lb 3.2 oz (102.2 kg)   SpO2 97%   BMI 34.85 kg/m?   ?Wt Readings from Last 3 Encounters:  ?12/05/21 225 lb 3.2 oz (102.2 kg)  ?10/25/21 222 lb 9.6 oz (101 kg)  ?11/17/14 215 lb (97.5 kg)  ?  ?Physical Exam ?Vitals and nursing note reviewed.  ?Constitutional:   ?   General: He is not in acute distress. ?   Appearance: Normal appearance. He is not ill-appearing, toxic-appearing or diaphoretic.  ?HENT:  ?   Head: Normocephalic.  ?   Right Ear: Tympanic membrane, ear canal and external ear normal.  ?   Left Ear: Tympanic membrane, ear canal and external ear normal.  ?   Nose: Nose normal. No congestion or rhinorrhea.  ?   Mouth/Throat:  ?   Mouth:  Mucous membranes are moist.  ?Eyes:  ?   General:     ?   Right eye: No discharge.     ?   Left eye: No discharge.  ?   Extraocular Movements: Extraocular movements intact.  ?   Conjunctiva/sclera: Conjunctivae normal.  ?   Pupils: Pupils are equal, round, and reactive to light.  ?Cardiovascular:  ?   Rate and Rhythm: Normal rate and regular rhythm.  ?   Heart sounds: No murmur heard. ?Pulmonary:  ?   Effort: Pulmonary effort is normal. No respiratory distress.  ?   Breath sounds: Normal breath sounds. No wheezing, rhonchi or rales.  ?Abdominal:  ?   General: Abdomen is flat. Bowel sounds are normal. There is no distension.  ?   Palpations: Abdomen is soft.  ?   Tenderness: There is no abdominal tenderness. There is no guarding.  ?Musculoskeletal:  ?   Cervical back: Normal range of motion and neck supple.  ?Skin: ?   General: Skin is warm and dry.  ?   Capillary Refill: Capillary refill takes less than 2 seconds.  ? ?    ?Neurological:  ?   General: No focal deficit present.  ?   Mental Status: He is alert and oriented to person, place, and time.  ?   Cranial Nerves: No cranial nerve deficit.  ?   Motor: No weakness.  ?   Deep Tendon Reflexes: Reflexes normal.  ?Psychiatric:     ?   Mood and Affect: Mood normal.     ?   Behavior: Behavior normal.     ?   Thought Content: Thought content normal.     ?   Judgment: Judgment normal.  ? ? ?Results for orders placed or performed in visit on 12/05/21  ?Urinalysis, Routine w reflex microscopic  ?Result Value Ref Range  ? Specific Gravity, UA 1.015 1.005 - 1.030  ? pH, UA 6.0 5.0 - 7.5  ? Color, UA Yellow Yellow  ? Appearance Ur Clear Clear  ? Leukocytes,UA Negative Negative  ? Protein,UA Negative Negative/Trace  ? Glucose, UA Negative Negative  ? Ketones, UA Negative Negative  ? RBC, UA Negative Negative  ? Bilirubin, UA Negative Negative  ? Urobilinogen, Ur 0.2 0.2 - 1.0 mg/dL  ? Nitrite, UA Negative Negative  ? ?   ?Assessment & Plan:  ? ?Problem List Items Addressed  This Visit   ? ?  ? Other  ? Obesity (BMI 30.0-34.9)  ?  Chronic. Patient would like to lose weight.  Will refer to medical weight management.  Recommend seeing them for assistance with weight loss.  ? ?  ?  ? Relevant Orders  ? Amb Ref to Medical Weight Management  ? ?Other Visit Diagnoses   ? ? Annual physical exam    -  Primary  ? Health maintenance reviewed during visit today. Labs ordered. Declined vaccines and Colonoscopy.  ? Relevant Orders  ? TSH  ? PSA  ? Lipid panel  ? CBC with Differential/Platelet  ? Comprehensive metabolic panel  ? Urinalysis, Routine w reflex microscopic (Completed)  ? Screening for ischemic heart disease      ? Relevant Orders  ? Lipid panel  ? Tinea cruris      ? Secondary to hockey gear. Will treat with lotrisone cream.  Apply daily for 3 weeks. Return to clinic if symptoms worsen or fail to improve.  ? Relevant Medications  ? clotrimazole-betamethasone (LOTRISONE) cream  ? ?  ?  ? ?Discussed aspirin prophylaxis for myocardial infarction prevention and decision was it was not indicated ? ?LABORATORY TESTING:  ?Health maintenance labs ordered today as discussed above.  ? ?The natural history of prostate cancer and ongoing controversy regarding screening and potential treatment outcomes of prostate cancer has been discussed with the patient. The meaning of a false positive PSA and a false negative PSA has been discussed. He indicates understanding of the limitations of this screening test and wishes to proceed with screening PSA testing. ? ? ?IMMUNIZATIONS:   ?- Tdap: Tetanus vaccination status reviewed: last tetanus booster within 10 years. ?- Influenza: Postponed to flu season ?- Pneumovax: Not applicable ?- Prevnar: Not applicable ?- COVID:  Discussed at visit today ?- HPV: Not applicable ?- Shingrix vaccine:  Discussed at visit today ? ?SCREENING: ?- Colonoscopy: Refused  ?Discussed with patient purpose of the colonoscopy is to detect colon cancer at curable precancerous or early  stages  ? ?- AAA Screening: Not applicable  ?-Hearing Test: Not applicable  ?-Spirometry: Not applicable  ? ?PATIENT COUNSELING:   ? ?Sexuality: Discussed sexually transmitted diseases, partner selection, use of condoms, a

## 2021-12-05 ENCOUNTER — Ambulatory Visit (INDEPENDENT_AMBULATORY_CARE_PROVIDER_SITE_OTHER): Payer: BC Managed Care – PPO | Admitting: Nurse Practitioner

## 2021-12-05 ENCOUNTER — Encounter: Payer: Self-pay | Admitting: Nurse Practitioner

## 2021-12-05 VITALS — BP 123/78 | HR 54 | Temp 98.5°F | Ht 67.4 in | Wt 225.2 lb

## 2021-12-05 DIAGNOSIS — Z136 Encounter for screening for cardiovascular disorders: Secondary | ICD-10-CM

## 2021-12-05 DIAGNOSIS — Z0000 Encounter for general adult medical examination without abnormal findings: Secondary | ICD-10-CM

## 2021-12-05 DIAGNOSIS — E669 Obesity, unspecified: Secondary | ICD-10-CM | POA: Diagnosis not present

## 2021-12-05 DIAGNOSIS — B356 Tinea cruris: Secondary | ICD-10-CM

## 2021-12-05 LAB — URINALYSIS, ROUTINE W REFLEX MICROSCOPIC
Bilirubin, UA: NEGATIVE
Glucose, UA: NEGATIVE
Ketones, UA: NEGATIVE
Leukocytes,UA: NEGATIVE
Nitrite, UA: NEGATIVE
Protein,UA: NEGATIVE
RBC, UA: NEGATIVE
Specific Gravity, UA: 1.015 (ref 1.005–1.030)
Urobilinogen, Ur: 0.2 mg/dL (ref 0.2–1.0)
pH, UA: 6 (ref 5.0–7.5)

## 2021-12-05 MED ORDER — CLOTRIMAZOLE-BETAMETHASONE 1-0.05 % EX CREA: 1.0000 "application " | TOPICAL_CREAM | Freq: Every day | CUTANEOUS | 0 refills | Status: DC

## 2021-12-05 NOTE — Assessment & Plan Note (Signed)
Chronic. Patient would like to lose weight.  Will refer to medical weight management.  Recommend seeing them for assistance with weight loss.  ?

## 2021-12-06 LAB — CBC WITH DIFFERENTIAL/PLATELET
Basophils Absolute: 0 10*3/uL (ref 0.0–0.2)
Basos: 0 %
EOS (ABSOLUTE): 0.1 10*3/uL (ref 0.0–0.4)
Eos: 2 %
Hematocrit: 46.5 % (ref 37.5–51.0)
Hemoglobin: 16.4 g/dL (ref 13.0–17.7)
Immature Grans (Abs): 0 10*3/uL (ref 0.0–0.1)
Immature Granulocytes: 0 %
Lymphocytes Absolute: 1.4 10*3/uL (ref 0.7–3.1)
Lymphs: 29 %
MCH: 30.5 pg (ref 26.6–33.0)
MCHC: 35.3 g/dL (ref 31.5–35.7)
MCV: 86 fL (ref 79–97)
Monocytes Absolute: 0.5 10*3/uL (ref 0.1–0.9)
Monocytes: 10 %
Neutrophils Absolute: 2.9 10*3/uL (ref 1.4–7.0)
Neutrophils: 59 %
Platelets: 269 10*3/uL (ref 150–450)
RBC: 5.38 x10E6/uL (ref 4.14–5.80)
RDW: 12.7 % (ref 11.6–15.4)
WBC: 5 10*3/uL (ref 3.4–10.8)

## 2021-12-06 LAB — COMPREHENSIVE METABOLIC PANEL
ALT: 29 IU/L (ref 0–44)
AST: 24 IU/L (ref 0–40)
Albumin/Globulin Ratio: 2.1 (ref 1.2–2.2)
Albumin: 4.7 g/dL (ref 3.8–4.9)
Alkaline Phosphatase: 90 IU/L (ref 44–121)
BUN/Creatinine Ratio: 14 (ref 9–20)
BUN: 12 mg/dL (ref 6–24)
Bilirubin Total: 0.3 mg/dL (ref 0.0–1.2)
CO2: 20 mmol/L (ref 20–29)
Calcium: 9.6 mg/dL (ref 8.7–10.2)
Chloride: 104 mmol/L (ref 96–106)
Creatinine, Ser: 0.86 mg/dL (ref 0.76–1.27)
Globulin, Total: 2.2 g/dL (ref 1.5–4.5)
Glucose: 98 mg/dL (ref 70–99)
Potassium: 4.7 mmol/L (ref 3.5–5.2)
Sodium: 139 mmol/L (ref 134–144)
Total Protein: 6.9 g/dL (ref 6.0–8.5)
eGFR: 101 mL/min/{1.73_m2} (ref >59)

## 2021-12-06 LAB — LIPID PANEL
Chol/HDL Ratio: 4.6 ratio (ref 0.0–5.0)
Cholesterol, Total: 278 mg/dL — ABNORMAL HIGH (ref 100–199)
HDL: 60 mg/dL (ref >39)
LDL Chol Calc (NIH): 193 mg/dL — ABNORMAL HIGH (ref 0–99)
Triglycerides: 137 mg/dL (ref 0–149)
VLDL Cholesterol Cal: 25 mg/dL (ref 5–40)

## 2021-12-06 LAB — PSA: Prostate Specific Ag, Serum: 1.1 ng/mL (ref 0.0–4.0)

## 2021-12-06 LAB — TSH: TSH: 2.95 u[IU]/mL (ref 0.450–4.500)

## 2021-12-06 NOTE — Progress Notes (Signed)
Please let patient know that his cholesterol is elevated.  His cardiac risk score puts him at high risk of having a stroke or heart attack over the next 10 years.  I recommend that he start a statin called crestor 5mg  daily.  The goal will be to increase this to 20mg  daily if patient tolerates it well.  If he agrees to the medication I can send it to the pharmacy.  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  I do recommend he follow a low fat diet and continue with his exercise but we likely won't see a huge improvement without medication. ? ?Otherwise, his blood work looks good.  No concerns with liver, kidneys, electrolytes or thyroid. ? ? ?The 10-year ASCVD risk score (Arnett DK, et al., 2019) is: 7.6% ?  Values used to calculate the score: ?    Age: 56 years ?    Sex: Male ?    Is Non-Hispanic African American: No ?    Diabetic: No ?    Tobacco smoker: No ?    Systolic Blood Pressure: 123 mmHg ?    Is BP treated: No ?    HDL Cholesterol: 60 mg/dL ?    Total Cholesterol: 278 mg/dL ?

## 2022-01-02 DIAGNOSIS — J301 Allergic rhinitis due to pollen: Secondary | ICD-10-CM | POA: Diagnosis not present

## 2022-01-02 DIAGNOSIS — H9313 Tinnitus, bilateral: Secondary | ICD-10-CM | POA: Diagnosis not present

## 2022-02-07 DIAGNOSIS — M9901 Segmental and somatic dysfunction of cervical region: Secondary | ICD-10-CM | POA: Diagnosis not present

## 2022-02-07 DIAGNOSIS — M9904 Segmental and somatic dysfunction of sacral region: Secondary | ICD-10-CM | POA: Diagnosis not present

## 2022-02-07 DIAGNOSIS — M5413 Radiculopathy, cervicothoracic region: Secondary | ICD-10-CM | POA: Diagnosis not present

## 2022-02-07 DIAGNOSIS — M9903 Segmental and somatic dysfunction of lumbar region: Secondary | ICD-10-CM | POA: Diagnosis not present

## 2022-02-08 ENCOUNTER — Encounter: Payer: Self-pay | Admitting: Nurse Practitioner

## 2022-02-08 ENCOUNTER — Ambulatory Visit (INDEPENDENT_AMBULATORY_CARE_PROVIDER_SITE_OTHER): Payer: BC Managed Care – PPO | Admitting: Nurse Practitioner

## 2022-02-08 VITALS — BP 113/78 | HR 75 | Temp 98.5°F | Wt 226.6 lb

## 2022-02-08 DIAGNOSIS — E669 Obesity, unspecified: Secondary | ICD-10-CM | POA: Diagnosis not present

## 2022-02-08 NOTE — Assessment & Plan Note (Signed)
Chronic. Would like to start [facility].  Explained that medication is on back order.  Offered patient Saxenda.  He would like to wait for [facility].  Will follow up in September to start medication.

## 2022-02-08 NOTE — Progress Notes (Signed)
BP 113/78   Pulse 75   Temp 98.5 F (36.9 C) (Oral)   Wt 226 lb 9.6 oz (102.8 kg)   SpO2 97%   BMI 35.07 kg/m    Subjective:    Patient ID: David Taylor, male    DOB: [birth year], 56 y.o.   MRN: [id number]  HPI: David Taylor is a 56 y.o. male  Chief Complaint  Patient presents with   Weight Loss    Pt would like to discuss weight loss injections states wife is using injections and would like to do the same    WEIGHT CONCERN Duration: years Previous attempts at weight loss:  diet and exercise Complications of obesity: none Peak weight: 226 lb Weight loss goal: 180-190 lb Weight loss to date: 6 months Requesting obesity pharmacotherapy: yes Current weight loss supplements/medications: no Previous weight loss supplements/meds: no Calories: doesn't keep track   Relevant past medical, surgical, family and social history reviewed and updated as indicated. Interim medical history since our last visit reviewed. Allergies and medications reviewed and updated.  Review of Systems  Constitutional:  Positive for unexpected weight change.    Per HPI unless specifically indicated above     Objective:    BP 113/78   Pulse 75   Temp 98.5 F (36.9 C) (Oral)   Wt 226 lb 9.6 oz (102.8 kg)   SpO2 97%   BMI 35.07 kg/m   Wt Readings from Last 3 Encounters:  02/08/22 226 lb 9.6 oz (102.8 kg)  12/05/21 225 lb 3.2 oz (102.2 kg)  10/25/21 222 lb 9.6 oz (101 kg)    Physical Exam Vitals and nursing note reviewed.  Constitutional:      General: He is not in acute distress.    Appearance: Normal appearance. He is obese. He is not ill-appearing, toxic-appearing or diaphoretic.  HENT:     Head: Normocephalic.     Right Ear: External ear normal.     Left Ear: External ear normal.     Nose: Nose normal. No congestion or rhinorrhea.     Mouth/Throat:     Mouth: Mucous membranes are moist.  Eyes:     General:        Right eye: No discharge.        Left eye: No discharge.      Extraocular Movements: Extraocular movements intact.     Conjunctiva/sclera: Conjunctivae normal.     Pupils: Pupils are equal, round, and reactive to light.  Cardiovascular:     Rate and Rhythm: Normal rate and regular rhythm.     Heart sounds: No murmur heard. Pulmonary:     Effort: Pulmonary effort is normal. No respiratory distress.     Breath sounds: Normal breath sounds. No wheezing, rhonchi or rales.  Abdominal:     General: Abdomen is flat. Bowel sounds are normal.  Musculoskeletal:     Cervical back: Normal range of motion and neck supple.  Skin:    General: Skin is warm and dry.     Capillary Refill: Capillary refill takes less than 2 seconds.  Neurological:     General: No focal deficit present.     Mental Status: He is alert and oriented to person, place, and time.  Psychiatric:        Mood and Affect: Mood normal.        Behavior: Behavior normal.        Thought Content: Thought content normal.        Judgment: Judgment normal.  Results for orders placed or performed in visit on 12/05/21  TSH  Result Value Ref Range   TSH 2.950 0.450 - 4.500 uIU/mL  PSA  Result Value Ref Range   Prostate Specific Ag, Serum 1.1 0.0 - 4.0 ng/mL  Lipid panel  Result Value Ref Range   Cholesterol, Total 278 (H) 100 - 199 mg/dL   Triglycerides 137 0 - 149 mg/dL   HDL 60 >39 mg/dL   VLDL Cholesterol Cal 25 5 - 40 mg/dL   LDL Chol Calc (NIH) 193 (H) 0 - 99 mg/dL   Lipid Comment: Comment    Chol/HDL Ratio 4.6 0.0 - 5.0 ratio  CBC with Differential/Platelet  Result Value Ref Range   WBC 5.0 3.4 - 10.8 x10E3/uL   RBC 5.38 4.14 - 5.80 x10E6/uL   Hemoglobin 16.4 13.0 - 17.7 g/dL   Hematocrit 46.5 37.5 - 51.0 %   MCV 86 79 - 97 fL   MCH 30.5 26.6 - 33.0 pg   MCHC 35.3 31.5 - 35.7 g/dL   RDW 12.7 11.6 - 15.4 %   Platelets 269 150 - 450 x10E3/uL   Neutrophils 59 Not Estab. %   Lymphs 29 Not Estab. %   Monocytes 10 Not Estab. %   Eos 2 Not Estab. %   Basos 0 Not Estab. %    Neutrophils Absolute 2.9 1.4 - 7.0 x10E3/uL   Lymphocytes Absolute 1.4 0.7 - 3.1 x10E3/uL   Monocytes Absolute 0.5 0.1 - 0.9 x10E3/uL   EOS (ABSOLUTE) 0.1 0.0 - 0.4 x10E3/uL   Basophils Absolute 0.0 0.0 - 0.2 x10E3/uL   Immature Granulocytes 0 Not Estab. %   Immature Grans (Abs) 0.0 0.0 - 0.1 x10E3/uL  Comprehensive metabolic panel  Result Value Ref Range   Glucose 98 70 - 99 mg/dL   BUN 12 6 - 24 mg/dL   Creatinine, Ser 0.86 0.76 - 1.27 mg/dL   eGFR 101 >59 mL/min/1.73   BUN/Creatinine Ratio 14 9 - 20   Sodium 139 134 - 144 mmol/L   Potassium 4.7 3.5 - 5.2 mmol/L   Chloride 104 96 - 106 mmol/L   CO2 20 20 - 29 mmol/L   Calcium 9.6 8.7 - 10.2 mg/dL   Total Protein 6.9 6.0 - 8.5 g/dL   Albumin 4.7 3.8 - 4.9 g/dL   Globulin, Total 2.2 1.5 - 4.5 g/dL   Albumin/Globulin Ratio 2.1 1.2 - 2.2   Bilirubin Total 0.3 0.0 - 1.2 mg/dL   Alkaline Phosphatase 90 44 - 121 IU/L   AST 24 0 - 40 IU/L   ALT 29 0 - 44 IU/L  Urinalysis, Routine w reflex microscopic  Result Value Ref Range   Specific Gravity, UA 1.015 1.005 - 1.030   pH, UA 6.0 5.0 - 7.5   Color, UA Yellow Yellow   Appearance Ur Clear Clear   Leukocytes,UA Negative Negative   Protein,UA Negative Negative/Trace   Glucose, UA Negative Negative   Ketones, UA Negative Negative   RBC, UA Negative Negative   Bilirubin, UA Negative Negative   Urobilinogen, Ur 0.2 0.2 - 1.0 mg/dL   Nitrite, UA Negative Negative      Assessment & Plan:   Problem List Items Addressed This Visit       Other   Obesity (BMI 30.0-34.9) - Primary    Chronic. Would like to start [facility].  Explained that medication is on back order.  Offered patient Saxenda.  He would like to wait for [facility].  Will follow up  in September to start medication.         Follow up plan: Return in about 3 months (around 05/11/2022) for Weight Managment.

## 2022-02-09 DIAGNOSIS — M5413 Radiculopathy, cervicothoracic region: Secondary | ICD-10-CM | POA: Diagnosis not present

## 2022-02-09 DIAGNOSIS — M9901 Segmental and somatic dysfunction of cervical region: Secondary | ICD-10-CM | POA: Diagnosis not present

## 2022-02-09 DIAGNOSIS — M9903 Segmental and somatic dysfunction of lumbar region: Secondary | ICD-10-CM | POA: Diagnosis not present

## 2022-02-09 DIAGNOSIS — M9904 Segmental and somatic dysfunction of sacral region: Secondary | ICD-10-CM | POA: Diagnosis not present

## 2022-02-16 DIAGNOSIS — M9904 Segmental and somatic dysfunction of sacral region: Secondary | ICD-10-CM | POA: Diagnosis not present

## 2022-02-16 DIAGNOSIS — M9903 Segmental and somatic dysfunction of lumbar region: Secondary | ICD-10-CM | POA: Diagnosis not present

## 2022-02-16 DIAGNOSIS — M5413 Radiculopathy, cervicothoracic region: Secondary | ICD-10-CM | POA: Diagnosis not present

## 2022-02-16 DIAGNOSIS — M9901 Segmental and somatic dysfunction of cervical region: Secondary | ICD-10-CM | POA: Diagnosis not present

## 2022-03-13 DIAGNOSIS — H00022 Hordeolum internum right lower eyelid: Secondary | ICD-10-CM | POA: Diagnosis not present

## 2022-03-13 DIAGNOSIS — B354 Tinea corporis: Secondary | ICD-10-CM | POA: Diagnosis not present

## 2022-04-07 NOTE — Progress Notes (Unsigned)
There were no vitals taken for this visit.   Subjective:    Patient ID: David Taylor, male    DOB: 1966/04/04, 56 y.o.   MRN: [id number]  HPI: David Taylor is a 56 y.o. male  No chief complaint on file.  WEIGHT CONCERN Duration: years Previous attempts at weight loss:  diet and exercise Complications of obesity: none Peak weight: 226 lb Weight loss goal: 180-190 lb Weight loss to date: 6 months Requesting obesity pharmacotherapy: yes Current weight loss supplements/medications: no Previous weight loss supplements/meds: no Calories: doesn't keep track   Relevant past medical, surgical, family and social history reviewed and updated as indicated. Interim medical history since our last visit reviewed. Allergies and medications reviewed and updated.  Review of Systems  Constitutional:  Positive for unexpected weight change.    Per HPI unless specifically indicated above     Objective:    There were no vitals taken for this visit.  Wt Readings from Last 3 Encounters:  02/08/22 226 lb 9.6 oz (102.8 kg)  12/05/21 225 lb 3.2 oz (102.2 kg)  10/25/21 222 lb 9.6 oz (101 kg)    Physical Exam Vitals and nursing note reviewed.  Constitutional:      General: He is not in acute distress.    Appearance: Normal appearance. He is obese. He is not ill-appearing, toxic-appearing or diaphoretic.  HENT:     Head: Normocephalic.     Right Ear: External ear normal.     Left Ear: External ear normal.     Nose: Nose normal. No congestion or rhinorrhea.     Mouth/Throat:     Mouth: Mucous membranes are moist.  Eyes:     General:        Right eye: No discharge.        Left eye: No discharge.     Extraocular Movements: Extraocular movements intact.     Conjunctiva/sclera: Conjunctivae normal.     Pupils: Pupils are equal, round, and reactive to light.  Cardiovascular:     Rate and Rhythm: Normal rate and regular rhythm.     Heart sounds: No murmur heard. Pulmonary:     Effort:  Pulmonary effort is normal. No respiratory distress.     Breath sounds: Normal breath sounds. No wheezing, rhonchi or rales.  Abdominal:     General: Abdomen is flat. Bowel sounds are normal.  Musculoskeletal:     Cervical back: Normal range of motion and neck supple.  Skin:    General: Skin is warm and dry.     Capillary Refill: Capillary refill takes less than 2 seconds.  Neurological:     General: No focal deficit present.     Mental Status: He is alert and oriented to person, place, and time.  Psychiatric:        Mood and Affect: Mood normal.        Behavior: Behavior normal.        Thought Content: Thought content normal.        Judgment: Judgment normal.    Results for orders placed or performed in visit on 12/05/21  TSH  Result Value Ref Range   TSH 2.950 0.450 - 4.500 uIU/mL  PSA  Result Value Ref Range   Prostate Specific Ag, Serum 1.1 0.0 - 4.0 ng/mL  Lipid panel  Result Value Ref Range   Cholesterol, Total 278 (H) 100 - 199 mg/dL   Triglycerides 137 0 - 149 mg/dL   HDL 60 >39 mg/dL  VLDL Cholesterol Cal 25 5 - 40 mg/dL   LDL Chol Calc (NIH) 193 (H) 0 - 99 mg/dL   Lipid Comment: Comment    Chol/HDL Ratio 4.6 0.0 - 5.0 ratio  CBC with Differential/Platelet  Result Value Ref Range   WBC 5.0 3.4 - 10.8 x10E3/uL   RBC 5.38 4.14 - 5.80 x10E6/uL   Hemoglobin 16.4 13.0 - 17.7 g/dL   Hematocrit 46.5 37.5 - 51.0 %   MCV 86 79 - 97 fL   MCH 30.5 26.6 - 33.0 pg   MCHC 35.3 31.5 - 35.7 g/dL   RDW 12.7 11.6 - 15.4 %   Platelets 269 150 - 450 x10E3/uL   Neutrophils 59 Not Estab. %   Lymphs 29 Not Estab. %   Monocytes 10 Not Estab. %   Eos 2 Not Estab. %   Basos 0 Not Estab. %   Neutrophils Absolute 2.9 1.4 - 7.0 x10E3/uL   Lymphocytes Absolute 1.4 0.7 - 3.1 x10E3/uL   Monocytes Absolute 0.5 0.1 - 0.9 x10E3/uL   EOS (ABSOLUTE) 0.1 0.0 - 0.4 x10E3/uL   Basophils Absolute 0.0 0.0 - 0.2 x10E3/uL   Immature Granulocytes 0 Not Estab. %   Immature Grans (Abs) 0.0 0.0 -  0.1 x10E3/uL  Comprehensive metabolic panel  Result Value Ref Range   Glucose 98 70 - 99 mg/dL   BUN 12 6 - 24 mg/dL   Creatinine, Ser 0.86 0.76 - 1.27 mg/dL   eGFR 101 >59 mL/min/1.73   BUN/Creatinine Ratio 14 9 - 20   Sodium 139 134 - 144 mmol/L   Potassium 4.7 3.5 - 5.2 mmol/L   Chloride 104 96 - 106 mmol/L   CO2 20 20 - 29 mmol/L   Calcium 9.6 8.7 - 10.2 mg/dL   Total Protein 6.9 6.0 - 8.5 g/dL   Albumin 4.7 3.8 - 4.9 g/dL   Globulin, Total 2.2 1.5 - 4.5 g/dL   Albumin/Globulin Ratio 2.1 1.2 - 2.2   Bilirubin Total 0.3 0.0 - 1.2 mg/dL   Alkaline Phosphatase 90 44 - 121 IU/L   AST 24 0 - 40 IU/L   ALT 29 0 - 44 IU/L  Urinalysis, Routine w reflex microscopic  Result Value Ref Range   Specific Gravity, UA 1.015 1.005 - 1.030   pH, UA 6.0 5.0 - 7.5   Color, UA Yellow Yellow   Appearance Ur Clear Clear   Leukocytes,UA Negative Negative   Protein,UA Negative Negative/Trace   Glucose, UA Negative Negative   Ketones, UA Negative Negative   RBC, UA Negative Negative   Bilirubin, UA Negative Negative   Urobilinogen, Ur 0.2 0.2 - 1.0 mg/dL   Nitrite, UA Negative Negative      Assessment & Plan:   Problem List Items Addressed This Visit      Other   Obesity (BMI 30.0-34.9) - Primary     Follow up plan: No follow-ups on file.

## 2022-04-10 ENCOUNTER — Ambulatory Visit (INDEPENDENT_AMBULATORY_CARE_PROVIDER_SITE_OTHER): Payer: BC Managed Care – PPO | Admitting: Nurse Practitioner

## 2022-04-10 ENCOUNTER — Encounter: Payer: Self-pay | Admitting: Nurse Practitioner

## 2022-04-10 VITALS — BP 119/80 | HR 61 | Temp 98.4°F | Wt 229.1 lb

## 2022-04-10 DIAGNOSIS — E669 Obesity, unspecified: Secondary | ICD-10-CM

## 2022-04-10 MED ORDER — WEGOVY 0.25 MG/0.5ML ~~LOC~~ SOAJ: 0.2500 mg | SUBCUTANEOUS | 0 refills | Status: DC

## 2022-04-10 NOTE — Assessment & Plan Note (Signed)
Chronic.  Will start Wegovy.  Side effects and benefits of medication discussed during visit.  Discussed how to inject the medication.  Discussed titrating medication up.  Follow up in 1 month.  Call sooner if concerns arise.

## 2022-04-11 ENCOUNTER — Telehealth: Payer: Self-pay

## 2022-04-11 NOTE — Telephone Encounter (Signed)
PA for [facility] has been initiated via covermymeds. Awaiting determination.   Key: David Taylor

## 2022-04-11 NOTE — Telephone Encounter (Signed)
PA for David Taylor has been approved. Patient notified via VM (left details).

## 2022-04-19 NOTE — Telephone Encounter (Signed)
Spoke with patient, he was wondering if we needed to do another PA for his next dose. Advised patient he is approved from 04/11/2022 through 08/14/2022. Pt voiced understanding.

## 2022-04-19 NOTE — Telephone Encounter (Signed)
Pt called to speak to clinic about his [facility]

## 2022-04-24 MED ORDER — WEGOVY 0.5 MG/0.5ML ~~LOC~~ SOAJ: 0.5000 mg | SUBCUTANEOUS | 0 refills | Status: DC

## 2022-04-24 NOTE — Addendum Note (Signed)
Addended by: Larae Grooms on: 04/24/2022 02:48 PM   Modules accepted: Orders

## 2022-04-24 NOTE — Telephone Encounter (Signed)
Medication sent to the pharmacy.

## 2022-04-24 NOTE — Telephone Encounter (Signed)
Pt's wife called saying her husband needs the .5 of the [facility] sent to [facility]. He was told the PA was done and he needs to move up on his dose.  CB@  [phone]

## 2022-04-24 NOTE — Telephone Encounter (Signed)
Patient voiced understanding.

## 2022-04-24 NOTE — Addendum Note (Signed)
Addended by: Larae Grooms on: 04/24/2022 04:01 PM   Modules accepted: Orders

## 2022-04-24 NOTE — Telephone Encounter (Signed)
Pt has been made aware of medication sent to pharmacy

## 2022-04-24 NOTE — Telephone Encounter (Signed)
Pt states [facility] will not have this med  Semaglutide-Weight Management (WEGOVY) 0.5 MG/0.5ML SOAJ  Until next year.  Can you please send to [facility] - [place], [place] - [address] [phone]

## 2022-05-04 ENCOUNTER — Other Ambulatory Visit: Payer: Self-pay | Admitting: Nurse Practitioner

## 2022-05-04 NOTE — Telephone Encounter (Signed)
rx dose was increased on 04/24/22 by provider.   Requested Prescriptions  Pending Prescriptions Disp Refills  . WEGOVY 0.25 MG/0.5ML Marcie Mowers Med Name: QIWLNL 0.25mg /dose Pre-Filled Pen Solution for Injection] 2 mL 0    Sig: Inject 0.25 mg under the skin once weekly.     Endocrinology:  Diabetes - GLP-1 Receptor Agonists - semaglutide Failed - 05/04/2022  1:00 PM      Failed - HBA1C in normal range and within 180 days    No results found for: "HGBA1C", "LABA1C"       Passed - Cr in normal range and within 360 days    Creatinine, Ser  Date Value Ref Range Status  12/05/2021 0.86 0.76 - 1.27 mg/dL Final         Passed - Valid encounter within last 6 months    Recent Outpatient Visits          3 weeks ago Obesity (BMI 30.0-34.9)   [place], NP   2 months ago Obesity (BMI 30.0-34.9)   [facility] Jon Billings, NP   5 months ago Annual physical exam   [facility] Jon Billings, NP   6 months ago Body odor   [place], NP      Future Appointments            In 1 week Jon Billings, NP [facility], [place]   In 7 months Jon Billings, NP [facility], [place]

## 2022-05-15 NOTE — Progress Notes (Signed)
BP 116/80   Pulse (!) 59   Temp 97.9 F (36.6 C) (Oral)   Wt 217 lb 4.8 oz (98.6 kg)   SpO2 96%   BMI 33.63 kg/m    Subjective:    Patient ID: David Taylor, male    DOB: [birth year], 56 y.o.   MRN: [id number]  HPI: David Taylor is a 56 y.o. male  Chief Complaint  Patient presents with   Weight Check   Back Pain    Low back pain. Denies recent fall/injury, no heavy lifting recently. Pt reports urgency/frequency to urinate.    WEIGHT CONCERN Patient states he is doing well.  Has lost about 10lbs since starting the [facility].  Had some constipation with the medication.   Duration: years Previous attempts at weight loss:  diet and exercise Complications of obesity: none Peak weight: 226 lb Weight loss goal: 180-190 lb Weight loss to date: 6 months Requesting obesity pharmacotherapy: yes Current weight loss supplements/medications: no Previous weight loss supplements/meds: no Calories: doesn't keep track   BACK PAIN Duration: days Mechanism of injury: unknown Location: low back Onset: sudden Severity: mild Quality: throbbing Frequency: intermittent Radiation: none Aggravating factors: none Alleviating factors: nothing Status: stable Treatments attempted: none  Relief with NSAIDs?: No NSAIDs Taken Nighttime pain:  no Paresthesias / decreased sensation:  no Bowel / bladder incontinence:  no Fevers:  no Dysuria / urinary frequency:  no   Relevant past medical, surgical, family and social history reviewed and updated as indicated. Interim medical history since our last visit reviewed. Allergies and medications reviewed and updated.  Review of Systems  Musculoskeletal:  Positive for back pain.    Per HPI unless specifically indicated above     Objective:    BP 116/80   Pulse (!) 59   Temp 97.9 F (36.6 C) (Oral)   Wt 217 lb 4.8 oz (98.6 kg)   SpO2 96%   BMI 33.63 kg/m   Wt Readings from Last 3 Encounters:  05/16/22 217 lb 4.8 oz (98.6 kg)  04/10/22 229  lb 1.6 oz (103.9 kg)  02/08/22 226 lb 9.6 oz (102.8 kg)    Physical Exam Vitals and nursing note reviewed.  Constitutional:      General: He is not in acute distress.    Appearance: Normal appearance. He is obese. He is not ill-appearing, toxic-appearing or diaphoretic.  HENT:     Head: Normocephalic.     Right Ear: External ear normal.     Left Ear: External ear normal.     Nose: Nose normal. No congestion or rhinorrhea.     Mouth/Throat:     Mouth: Mucous membranes are moist.  Eyes:     General:        Right eye: No discharge.        Left eye: No discharge.     Extraocular Movements: Extraocular movements intact.     Conjunctiva/sclera: Conjunctivae normal.     Pupils: Pupils are equal, round, and reactive to light.  Cardiovascular:     Rate and Rhythm: Normal rate and regular rhythm.     Heart sounds: No murmur heard. Pulmonary:     Effort: Pulmonary effort is normal. No respiratory distress.     Breath sounds: Normal breath sounds. No wheezing, rhonchi or rales.  Abdominal:     General: Abdomen is flat. Bowel sounds are normal. There is no distension.     Tenderness: There is no abdominal tenderness. There is no right CVA tenderness, left CVA  tenderness or guarding.  Musculoskeletal:     Cervical back: Normal range of motion and neck supple.  Skin:    General: Skin is warm and dry.     Capillary Refill: Capillary refill takes less than 2 seconds.  Neurological:     General: No focal deficit present.     Mental Status: He is alert and oriented to person, place, and time.  Psychiatric:        Mood and Affect: Mood normal.        Behavior: Behavior normal.        Thought Content: Thought content normal.        Judgment: Judgment normal.     Results for orders placed or performed in visit on 05/16/22  Urinalysis, Routine w reflex microscopic  Result Value Ref Range   Specific Gravity, UA <1.005 (L) 1.005 - 1.030   pH, UA 6.5 5.0 - 7.5   Color, UA Yellow Yellow    Appearance Ur Clear Clear   Leukocytes,UA Negative Negative   Protein,UA Negative Negative/Trace   Glucose, UA Negative Negative   Ketones, UA Negative Negative   RBC, UA Negative Negative   Bilirubin, UA Negative Negative   Urobilinogen, Ur 0.2 0.2 - 1.0 mg/dL   Nitrite, UA Negative Negative      Assessment & Plan:   Problem List Items Addressed This Visit       Other   Obesity (BMI 30.0-34.9) - Primary    Chronic.  On Wegovy.  Has last 12lbs in the last month.  Has had some side effects with Wegovy 0.25mg .  Will continue 0.25mg  for 1 more month.  Then increase dose to 0.5mg .  Continue with weight loss efforts.  Follow up in 2 months.  Call sooner if concerns arise.       Other Visit Diagnoses     Acute bilateral low back pain, unspecified whether sciatica present       Neg UTI.  Seems related to hydration.  Recommend increasing water into to 64 ounces daily.     Relevant Orders   Urinalysis, Routine w reflex microscopic (Completed)        Follow up plan: Return in about 2 months (around 07/16/2022) for Weight Managment.

## 2022-05-16 ENCOUNTER — Encounter: Payer: Self-pay | Admitting: Nurse Practitioner

## 2022-05-16 ENCOUNTER — Ambulatory Visit (INDEPENDENT_AMBULATORY_CARE_PROVIDER_SITE_OTHER): Payer: BC Managed Care – PPO | Admitting: Nurse Practitioner

## 2022-05-16 VITALS — BP 116/80 | HR 59 | Temp 97.9°F | Wt 217.3 lb

## 2022-05-16 DIAGNOSIS — M545 Low back pain, unspecified: Secondary | ICD-10-CM | POA: Diagnosis not present

## 2022-05-16 DIAGNOSIS — E669 Obesity, unspecified: Secondary | ICD-10-CM | POA: Diagnosis not present

## 2022-05-16 LAB — URINALYSIS, ROUTINE W REFLEX MICROSCOPIC
Bilirubin, UA: NEGATIVE
Glucose, UA: NEGATIVE
Ketones, UA: NEGATIVE
Leukocytes,UA: NEGATIVE
Nitrite, UA: NEGATIVE
Protein,UA: NEGATIVE
RBC, UA: NEGATIVE
Specific Gravity, UA: <1.005 — ABNORMAL LOW (ref 1.005–1.030)
Urobilinogen, Ur: 0.2 mg/dL (ref 0.2–1.0)
pH, UA: 6.5 (ref 5.0–7.5)

## 2022-05-16 MED ORDER — WEGOVY 0.5 MG/0.5ML ~~LOC~~ SOAJ: 0.5000 mg | SUBCUTANEOUS | 0 refills | Status: DC

## 2022-05-16 MED ORDER — WEGOVY 0.25 MG/0.5ML ~~LOC~~ SOAJ: 0.2500 mg | SUBCUTANEOUS | 0 refills | Status: DC

## 2022-05-16 NOTE — Assessment & Plan Note (Signed)
Chronic.  On Wegovy.  Has last 12lbs in the last month.  Has had some side effects with Wegovy 0.25mg .  Will continue 0.25mg  for 1 more month.  Then increase dose to 0.5mg .  Continue with weight loss efforts.  Follow up in 2 months.  Call sooner if concerns arise.

## 2022-05-16 NOTE — Progress Notes (Signed)
Results discussed with patient during visit.

## 2022-05-23 ENCOUNTER — Telehealth: Payer: Self-pay

## 2022-05-23 NOTE — Telephone Encounter (Signed)
PA for [facility] has been initiated via covermymeds. Awaiting determination.   Key: [id number]

## 2022-06-26 ENCOUNTER — Other Ambulatory Visit: Payer: Self-pay | Admitting: Nurse Practitioner

## 2022-06-26 NOTE — Telephone Encounter (Signed)
Medication Refill - Medication: Needs both doses for [facility]   Semaglutide-Weight Management (WEGOVY) 0.25 MG/0.5ML SOAJ  Semaglutide-Weight Management (WEGOVY) 0.5 MG/0.5ML SOAJ   Has the patient contacted their pharmacy? Yes.   (Agent: If no, request that the patient contact the pharmacy for the refill. If patient does not wish to contact the pharmacy document the reason why and proceed with request.) (Agent: If yes, when and what did the pharmacy advise?)  Preferred Pharmacy (with phone number or street name):  [facility] - [place], [place] - [address]  [address] [place] [address]  Phone: [phone] Fax: [phone]   Has the patient been seen for an appointment in the last year OR does the patient have an upcoming appointment? Yes.    Agent: Please be advised that RX refills may take up to 3 business days. We ask that you follow-up with your pharmacy.

## 2022-06-27 ENCOUNTER — Telehealth: Payer: Self-pay | Admitting: Nurse Practitioner

## 2022-06-27 MED ORDER — WEGOVY 0.25 MG/0.5ML ~~LOC~~ SOAJ: 0.2500 mg | SUBCUTANEOUS | 0 refills | Status: DC

## 2022-06-27 MED ORDER — WEGOVY 0.5 MG/0.5ML ~~LOC~~ SOAJ: 0.5000 mg | SUBCUTANEOUS | 0 refills | Status: DC

## 2022-06-27 NOTE — Telephone Encounter (Signed)
Requested medications are due for refill today.  unsure  Requested medications are on the active medications list.  yes  Last refill. 05/16/2022 - unclear if rx was filled  Future visit scheduled.   yes  Notes to clinic.  Please review for refill. Unclear if pt was able to fill rx's.    Requested Prescriptions  Pending Prescriptions Disp Refills   Semaglutide-Weight Management (WEGOVY) 0.25 MG/0.5ML SOAJ 2 mL 0    Sig: Inject 0.25 mg into the skin once a week.     Endocrinology:  Diabetes - GLP-1 Receptor Agonists - semaglutide Failed - 06/26/2022  2:59 PM      Failed - HBA1C in normal range and within 180 days    No results found for: "HGBA1C", "LABA1C"       Passed - Cr in normal range and within 360 days    Creatinine, Ser  Date Value Ref Range Status  12/05/2021 0.86 0.76 - 1.27 mg/dL Final         Passed - Valid encounter within last 6 months    Recent Outpatient Visits           1 month ago Obesity (BMI 30.0-34.9)   [facility] Larae Grooms, NP   2 months ago Obesity (BMI 30.0-34.9)   [facility] Larae Grooms, NP   4 months ago Obesity (BMI 30.0-34.9)   [facility] Larae Grooms, NP   6 months ago Annual physical exam   [facility] Larae Grooms, NP   8 months ago Body odor   [facility] Larae Grooms, NP       Future Appointments             In 2 weeks Larae Grooms, NP [facility]   In 5 months Larae Grooms, NP [facility]             Semaglutide-Weight Management (WEGOVY) 0.5 MG/0.5ML SOAJ 2 mL 0    Sig: Inject 0.5 mg into the skin once a week.     Endocrinology:  Diabetes - GLP-1 Receptor Agonists - semaglutide Failed - 06/26/2022  2:59 PM      Failed - HBA1C in normal range and within 180 days    No results found for: "HGBA1C", "LABA1C"       Passed - Cr in normal range and within 360 days    Creatinine, Ser   Date Value Ref Range Status  12/05/2021 0.86 0.76 - 1.27 mg/dL Final         Passed - Valid encounter within last 6 months    Recent Outpatient Visits           1 month ago Obesity (BMI 30.0-34.9)   [facility] Larae Grooms, NP   2 months ago Obesity (BMI 30.0-34.9)   [facility] Larae Grooms, NP   4 months ago Obesity (BMI 30.0-34.9)   [facility] Larae Grooms, NP   6 months ago Annual physical exam   [facility] Larae Grooms, NP   8 months ago Body odor   [facility] Larae Grooms, NP       Future Appointments             In 2 weeks Larae Grooms, NP [facility]   In 5 months Larae Grooms, NP [facility]

## 2022-06-27 NOTE — Telephone Encounter (Signed)
The spouse of the patient, Lurena Joiner called in stating the insurance company will only cover the 1.0 of the [facility]. Please call her to assist further

## 2022-06-27 NOTE — Telephone Encounter (Signed)
Filled on 05/16/2022, follow up appt for 07/17/2022 at 8:40 AM

## 2022-06-28 MED ORDER — WEGOVY 1 MG/0.5ML ~~LOC~~ SOAJ: 1.0000 mg | SUBCUTANEOUS | 0 refills | Status: DC

## 2022-06-28 NOTE — Telephone Encounter (Signed)
LVM for patient regarding medication that has been sent to his pharmacy.

## 2022-07-13 NOTE — Progress Notes (Deleted)
There were no vitals taken for this visit.   Subjective:    Patient ID: David Taylor, male    DOB: [birth year], 56 y.o.   MRN: [id number]  HPI: David Taylor is a 56 y.o. male  No chief complaint on file.  WEIGHT CONCERN Patient states he is doing well.  Has lost about 10lbs since starting the [facility].  Had some constipation with the medication.   Duration: years Previous attempts at weight loss:  diet and exercise Complications of obesity: none Peak weight: 226 lb Weight loss goal: 180-190 lb Weight loss to date: 6 months Requesting obesity pharmacotherapy: yes Current weight loss supplements/medications: no Previous weight loss supplements/meds: no Calories: doesn't keep track     Relevant past medical, surgical, family and social history reviewed and updated as indicated. Interim medical history since our last visit reviewed. Allergies and medications reviewed and updated.  Review of Systems  Musculoskeletal:  Positive for back pain.    Per HPI unless specifically indicated above     Objective:    There were no vitals taken for this visit.  Wt Readings from Last 3 Encounters:  05/16/22 217 lb 4.8 oz (98.6 kg)  04/10/22 229 lb 1.6 oz (103.9 kg)  02/08/22 226 lb 9.6 oz (102.8 kg)    Physical Exam Vitals and nursing note reviewed.  Constitutional:      General: He is not in acute distress.    Appearance: Normal appearance. He is obese. He is not ill-appearing, toxic-appearing or diaphoretic.  HENT:     Head: Normocephalic.     Right Ear: External ear normal.     Left Ear: External ear normal.     Nose: Nose normal. No congestion or rhinorrhea.     Mouth/Throat:     Mouth: Mucous membranes are moist.  Eyes:     General:        Right eye: No discharge.        Left eye: No discharge.     Extraocular Movements: Extraocular movements intact.     Conjunctiva/sclera: Conjunctivae normal.     Pupils: Pupils are equal, round, and reactive to light.  Cardiovascular:      Rate and Rhythm: Normal rate and regular rhythm.     Heart sounds: No murmur heard. Pulmonary:     Effort: Pulmonary effort is normal. No respiratory distress.     Breath sounds: Normal breath sounds. No wheezing, rhonchi or rales.  Abdominal:     General: Abdomen is flat. Bowel sounds are normal. There is no distension.     Tenderness: There is no abdominal tenderness. There is no right CVA tenderness, left CVA tenderness or guarding.  Musculoskeletal:     Cervical back: Normal range of motion and neck supple.  Skin:    General: Skin is warm and dry.     Capillary Refill: Capillary refill takes less than 2 seconds.  Neurological:     General: No focal deficit present.     Mental Status: He is alert and oriented to person, place, and time.  Psychiatric:        Mood and Affect: Mood normal.        Behavior: Behavior normal.        Thought Content: Thought content normal.        Judgment: Judgment normal.     Results for orders placed or performed in visit on 05/16/22  Urinalysis, Routine w reflex microscopic  Result Value Ref Range   Specific Gravity, UA <  1.005 (L) 1.005 - 1.030   pH, UA 6.5 5.0 - 7.5   Color, UA Yellow Yellow   Appearance Ur Clear Clear   Leukocytes,UA Negative Negative   Protein,UA Negative Negative/Trace   Glucose, UA Negative Negative   Ketones, UA Negative Negative   RBC, UA Negative Negative   Bilirubin, UA Negative Negative   Urobilinogen, Ur 0.2 0.2 - 1.0 mg/dL   Nitrite, UA Negative Negative      Assessment & Plan:   Problem List Items Addressed This Visit       Other   Obesity (BMI 30.0-34.9) - Primary     Follow up plan: No follow-ups on file.

## 2022-07-17 ENCOUNTER — Ambulatory Visit: Payer: BC Managed Care – PPO | Admitting: Nurse Practitioner

## 2022-07-17 DIAGNOSIS — E669 Obesity, unspecified: Secondary | ICD-10-CM

## 2022-07-17 NOTE — Progress Notes (Addendum)
BP 109/68   Pulse 69   Temp 99.4 F (37.4 C) (Oral)   Wt 217 lb (98.4 kg)   SpO2 97%   BMI 33.58 kg/m    Subjective:    Patient ID: David Taylor, male    DOB: 1966-06-12, 56 y.o.   MRN: [id number]  HPI: David Taylor is a 56 y.o. male  Chief Complaint  Patient presents with   Weight Check   Tinnitus    Pt states he has been experiencing some ringing and sensitivity to his ears for the last few weeks.   WEIGHT CONCERN Patient states he is doing well.  Has lost about 12lbs since starting the [facility].  Patient states he stopped it and then went back on the medication.  No longer having problems with constipation.  Duration: years Previous attempts at weight loss:  diet and exercise Complications of obesity: none Peak weight: 226 lb Weight loss goal: 180-190 lb Weight loss to date: 6 months Requesting obesity pharmacotherapy: yes Current weight loss supplements/medications: no Previous weight loss supplements/meds: no Calories: doesn't keep track   Patient states he is having some ringing in his ears.  Started awhile ago.  States the ringing is pretty persistent.  Denies any congestion or headaches associated with the ringing.  He hasn't tried anything for the ringing.    Relevant past medical, surgical, family and social history reviewed and updated as indicated. Interim medical history since our last visit reviewed. Allergies and medications reviewed and updated.  Review of Systems  Constitutional:  Negative for unexpected weight change.  HENT:         Ringing in ears  Musculoskeletal:  Negative for back pain.    Per HPI unless specifically indicated above     Objective:    BP 109/68   Pulse 69   Temp 99.4 F (37.4 C) (Oral)   Wt 217 lb (98.4 kg)   SpO2 97%   BMI 33.58 kg/m   Wt Readings from Last 3 Encounters:  07/20/22 219 lb 4.8 oz (99.5 kg)  07/18/22 217 lb (98.4 kg)  05/16/22 217 lb 4.8 oz (98.6 kg)    Physical Exam Vitals and nursing note reviewed.   Constitutional:      General: He is not in acute distress.    Appearance: Normal appearance. He is obese. He is not ill-appearing, toxic-appearing or diaphoretic.  HENT:     Head: Normocephalic.     Right Ear: External ear normal. A middle ear effusion is present.     Left Ear: External ear normal. A middle ear effusion is present.     Nose: Nose normal. No congestion or rhinorrhea.     Mouth/Throat:     Mouth: Mucous membranes are moist.  Eyes:     General:        Right eye: No discharge.        Left eye: No discharge.     Extraocular Movements: Extraocular movements intact.     Conjunctiva/sclera: Conjunctivae normal.     Pupils: Pupils are equal, round, and reactive to light.  Cardiovascular:     Rate and Rhythm: Normal rate and regular rhythm.     Heart sounds: No murmur heard. Pulmonary:     Effort: Pulmonary effort is normal. No respiratory distress.     Breath sounds: Normal breath sounds. No wheezing, rhonchi or rales.  Abdominal:     General: Abdomen is flat. Bowel sounds are normal. There is no distension.  Tenderness: There is no abdominal tenderness. There is no right CVA tenderness, left CVA tenderness or guarding.  Musculoskeletal:     Cervical back: Normal range of motion and neck supple.  Skin:    General: Skin is warm and dry.     Capillary Refill: Capillary refill takes less than 2 seconds.  Neurological:     General: No focal deficit present.     Mental Status: He is alert and oriented to person, place, and time.  Psychiatric:        Mood and Affect: Mood normal.        Behavior: Behavior normal.        Thought Content: Thought content normal.        Judgment: Judgment normal.     Results for orders placed or performed in visit on 07/18/22  Comp Met (CMET)  Result Value Ref Range   Glucose 103 (H) 70 - 99 mg/dL   BUN 11 6 - 24 mg/dL   Creatinine, Ser 1.02 0.76 - 1.27 mg/dL   eGFR 86 >59 mL/min/1.73   BUN/Creatinine Ratio 11 9 - 20   Sodium 140  134 - 144 mmol/L   Potassium 4.7 3.5 - 5.2 mmol/L   Chloride 103 96 - 106 mmol/L   CO2 21 20 - 29 mmol/L   Calcium 9.6 8.7 - 10.2 mg/dL   Total Protein 7.0 6.0 - 8.5 g/dL   Albumin 4.7 3.8 - 4.9 g/dL   Globulin, Total 2.3 1.5 - 4.5 g/dL   Albumin/Globulin Ratio 2.0 1.2 - 2.2   Bilirubin Total 0.4 0.0 - 1.2 mg/dL   Alkaline Phosphatase 89 44 - 121 IU/L   AST 17 0 - 40 IU/L   ALT 20 0 - 44 IU/L  Lipid Profile  Result Value Ref Range   Cholesterol, Total 288 (H) 100 - 199 mg/dL   Triglycerides 139 0 - 149 mg/dL   HDL 57 >39 mg/dL   VLDL Cholesterol Cal 25 5 - 40 mg/dL   LDL Chol Calc (NIH) 206 (H) 0 - 99 mg/dL   Lipid Comment: Comment    Chol/HDL Ratio 5.1 (H) 0.0 - 5.0 ratio      Assessment & Plan:   Problem List Items Addressed This Visit       Other   Obesity (BMI 30.0-34.9)    Chronic. Patient is back on Wegovy and working on weight loss.  Recommended eating smaller high protein, low fat meals more frequently and exercising 30 mins a day 5 times a week with a goal of 10-15lb weight loss in the next 3 months. Patient voiced their understanding and motivation to adhere to these recommendations.       Relevant Orders   Comp Met (CMET) (Completed)   Hypercholesteremia    Labs ordered at visit today.  Will make recommendations based on lab results.         Relevant Orders   Lipid Profile (Completed)   Other Visit Diagnoses     Tinnitus of both ears    -  Primary   Recommend using antihistamine daily due to middle ear effusion.  If not improved, will send to ENT.        Follow up plan: Return in about 3 months (around 10/17/2022) for Weight Managment.

## 2022-07-18 ENCOUNTER — Ambulatory Visit: Payer: Self-pay

## 2022-07-18 ENCOUNTER — Encounter: Payer: Self-pay | Admitting: Nurse Practitioner

## 2022-07-18 ENCOUNTER — Ambulatory Visit (INDEPENDENT_AMBULATORY_CARE_PROVIDER_SITE_OTHER): Payer: BC Managed Care – PPO | Admitting: Nurse Practitioner

## 2022-07-18 VITALS — BP 109/68 | HR 69 | Temp 99.4°F | Wt 217.0 lb

## 2022-07-18 DIAGNOSIS — E7800 Pure hypercholesterolemia, unspecified: Secondary | ICD-10-CM | POA: Diagnosis not present

## 2022-07-18 DIAGNOSIS — H9313 Tinnitus, bilateral: Secondary | ICD-10-CM | POA: Diagnosis not present

## 2022-07-18 DIAGNOSIS — E669 Obesity, unspecified: Secondary | ICD-10-CM | POA: Diagnosis not present

## 2022-07-18 NOTE — Telephone Encounter (Signed)
Chief Complaint: Bilateral ear pain Symptoms: Ringing and feel full to both ears, severe pain Frequency: N/A Pertinent Negatives: Patient denies other symptoms Disposition: [] ED /[] Urgent Care (no appt availability in office) / [] Appointment(In office/virtual)/ []  [facility]/ [x] Home Care/ [] Refused Recommended Disposition /[] [facility]/ [x]  Follow-up with PCP Additional Notes: Patient called and says he had an appointment yesterday and thought something would be called in for his ears. I advised PCP noted to take antihistamine and if no better to call back and ENT referral would be entered. He asked what is an antihistamine, advised claritin, allegra, xyzal are daily meds to take and are OTC. He verbalized understanding.   Reason for Disposition  Mild earache and ear congestion (fullness) occurring during air travel  Answer Assessment - Initial Assessment Questions 1. LOCATION: "Which ear is involved?"     Both ears 2. SEVERITY: "How bad is the pain?"  (Scale 1-10; mild, moderate or severe)   - MILD (1-3): doesn't interfere with normal activities    - MODERATE (4-7): interferes with normal activities or awakens from sleep    - SEVERE (8-10): excruciating pain, unable to do any normal activities      Severe 3. OTHER SYMPTOMS: "Do you have any other symptoms?" (e.g., headache, stiff neck, dizziness, vomiting, runny nose, decreased hearing)     Fullness, ringing  Protocols used: Earache-A-AH

## 2022-07-18 NOTE — Assessment & Plan Note (Signed)
Chronic. Patient is back on Wegovy and working on weight loss.  Recommended eating smaller high protein, low fat meals more frequently and exercising 30 mins a day 5 times a week with a goal of 10-15lb weight loss in the next 3 months. Patient voiced their understanding and motivation to adhere to these recommendations.

## 2022-07-18 NOTE — Assessment & Plan Note (Signed)
Labs ordered at visit today.  Will make recommendations based on lab results.   

## 2022-07-19 ENCOUNTER — Telehealth: Payer: Self-pay | Admitting: Nurse Practitioner

## 2022-07-19 LAB — COMPREHENSIVE METABOLIC PANEL
ALT: 20 IU/L (ref 0–44)
AST: 17 IU/L (ref 0–40)
Albumin/Globulin Ratio: 2 (ref 1.2–2.2)
Albumin: 4.7 g/dL (ref 3.8–4.9)
Alkaline Phosphatase: 89 IU/L (ref 44–121)
BUN/Creatinine Ratio: 11 (ref 9–20)
BUN: 11 mg/dL (ref 6–24)
Bilirubin Total: 0.4 mg/dL (ref 0.0–1.2)
CO2: 21 mmol/L (ref 20–29)
Calcium: 9.6 mg/dL (ref 8.7–10.2)
Chloride: 103 mmol/L (ref 96–106)
Creatinine, Ser: 1.02 mg/dL (ref 0.76–1.27)
Globulin, Total: 2.3 g/dL (ref 1.5–4.5)
Glucose: 103 mg/dL — ABNORMAL HIGH (ref 70–99)
Potassium: 4.7 mmol/L (ref 3.5–5.2)
Sodium: 140 mmol/L (ref 134–144)
Total Protein: 7 g/dL (ref 6.0–8.5)
eGFR: 86 mL/min/{1.73_m2} (ref >59)

## 2022-07-19 LAB — LIPID PANEL
Chol/HDL Ratio: 5.1 ratio — ABNORMAL HIGH (ref 0.0–5.0)
Cholesterol, Total: 288 mg/dL — ABNORMAL HIGH (ref 100–199)
HDL: 57 mg/dL (ref >39)
LDL Chol Calc (NIH): 206 mg/dL — ABNORMAL HIGH (ref 0–99)
Triglycerides: 139 mg/dL (ref 0–149)
VLDL Cholesterol Cal: 25 mg/dL (ref 5–40)

## 2022-07-19 NOTE — Telephone Encounter (Signed)
Copied from CRM [id number]. Topic: General - Other >> Jul 18, 2022  3:47 PM Everette C wrote: Reason for CRM: The patient has called to confirm that their previously discussed prescription has been submitted to their pharmacy after their office visit today 07/18/22  Please contact the patient further when possible

## 2022-07-19 NOTE — Telephone Encounter (Signed)
See nurse triage note.

## 2022-07-19 NOTE — Progress Notes (Signed)
Please let patient know that his cholesterol is elevated.  His cardiac risk score puts him at moderate risk of having a stroke or heart attack over the next 10 years.  I recommend that he start a statin called crestor 5mg  daily.  The goal will be to increase this to 20mg  daily if patient tolerates it well.  If he agrees to the medication I can send it to the pharmacy.    Otherwise, lab work looks good.  No other concerns at this time.  The 10-year ASCVD risk score (Arnett DK, et al., 2019) is: 6.7%   Values used to calculate the score:     Age: 56 years     Sex: Male     Is Non-Hispanic African American: No     Diabetic: No     Tobacco smoker: No     Systolic Blood Pressure: 109 mmHg     Is BP treated: No     HDL Cholesterol: 57 mg/dL     Total Cholesterol: 288 mg/dL

## 2022-07-20 ENCOUNTER — Ambulatory Visit (INDEPENDENT_AMBULATORY_CARE_PROVIDER_SITE_OTHER): Payer: BC Managed Care – PPO | Admitting: Physician Assistant

## 2022-07-20 ENCOUNTER — Encounter: Payer: Self-pay | Admitting: Physician Assistant

## 2022-07-20 ENCOUNTER — Telehealth: Payer: Self-pay | Admitting: Nurse Practitioner

## 2022-07-20 VITALS — BP 113/82 | HR 70 | Temp 98.4°F | Wt 219.3 lb

## 2022-07-20 DIAGNOSIS — H6593 Unspecified nonsuppurative otitis media, bilateral: Secondary | ICD-10-CM | POA: Diagnosis not present

## 2022-07-20 NOTE — Telephone Encounter (Signed)
Copied from CRM [id number]. Topic: Referral - Request for Referral >> Jul 20, 2022  9:48 AM Franchot Heidelberg wrote: Has patient seen PCP for this complaint? Yes *If NO, is insurance requiring patient see PCP for this issue before PCP can refer them? Referral for which specialty: ENT Preferred provider/office: Highest recommended locally  Reason for referral: Water in ears, Claritin prescribed by PCP not helping.

## 2022-07-20 NOTE — Progress Notes (Signed)
Acute Office Visit   Patient: David Taylor   DOB: Oct 11, 1965   56 y.o. Male  MRN: [id number] Visit Date: 07/20/2022  Today's healthcare provider: Oswaldo Conroy Anikka Marsan, PA-C  Introduced myself to the patient as a Secondary school teacher and provided education on APPs in clinical practice.    Chief Complaint  Patient presents with   Ear Fullness    Patient is concerned for lingering ear fullness from 07/18/22 despite using antihistamine     Subjective    HPI HPI     Ear Fullness    Additional comments: Patient is concerned for lingering ear fullness from 07/18/22 despite using antihistamine        Last edited by Lavon Horn, Oswaldo Conroy, PA-C on 07/20/2022  7:52 PM.        Reports he is concerned about his ears States he was seen on 07/18/22 and instructed to use an antihistamine to assist with resolution of mid ear effusion He has been taking OTC antihistamines with decongestant since last apt but has not noted improvement     Medications: Outpatient Medications Prior to Visit  Medication Sig   Semaglutide-Weight Management (WEGOVY) 1 MG/0.5ML SOAJ Inject 1 mg into the skin once a week.   No facility-administered medications prior to visit.    Review of Systems  HENT:  Negative for ear discharge, ear pain, hearing loss, sore throat and tinnitus.        Ear fullness and mild pressure sensation        Objective    BP 113/82   Pulse 70   Temp 98.4 F (36.9 C) (Oral)   Wt 219 lb 4.8 oz (99.5 kg)   SpO2 97%   BMI 33.94 kg/m    Physical Exam Vitals reviewed.  Constitutional:      General: He is awake.     Appearance: Normal appearance. He is well-developed and well-groomed.  HENT:     Head: Normocephalic and atraumatic.     Right Ear: Hearing, ear canal and external ear normal. A middle ear effusion is present.     Left Ear: Hearing, ear canal and external ear normal. A middle ear effusion is present.  Pulmonary:     Effort: Pulmonary effort is normal.  Neurological:     General:  No focal deficit present.     Mental Status: He is alert and oriented to person, place, and time.  Psychiatric:        Mood and Affect: Mood normal.        Behavior: Behavior normal. Behavior is cooperative.        Thought Content: Thought content normal.        Judgment: Judgment normal.       No results found for any visits on 07/20/22.  Assessment & Plan      No follow-ups on file.      Problem List Items Addressed This Visit   None Visit Diagnoses     Middle ear effusion, bilateral    -  Primary Acute, new concern, ongoing Reviewed that antihistamine can take a few weeks to start improving symptoms and he should continue with recommended medication for several weeks  Recommend that he add Flonase to further assist with symptoms Follow up as needed for persistent or progressing symptoms          No follow-ups on file.   I, Jaiveer Panas E Kavan Devan, PA-C, have reviewed all documentation for this visit. The documentation on  07/20/22 for the exam, diagnosis, procedures, and orders are all accurate and complete.   Jacquelin Hawking, MHS, PA-C [facility]

## 2022-07-28 ENCOUNTER — Telehealth: Payer: Self-pay | Admitting: Nurse Practitioner

## 2022-07-28 MED ORDER — WEGOVY 1 MG/0.5ML ~~LOC~~ SOAJ: 1.0000 mg | SUBCUTANEOUS | 0 refills | Status: DC

## 2022-07-28 NOTE — Telephone Encounter (Signed)
Medication Refill - Medication: Semaglutide-Weight Management (WEGOVY) 1 MG/0.5ML SOAJ   Has the patient contacted their pharmacy? No.  Preferred Pharmacy (with phone number or street name):  [facility] - [place], [place] - [address] [place] [address] Phone: [phone]  Fax: [phone]     Has the patient been seen for an appointment in the last year OR does the patient have an upcoming appointment? Yes.    Agent: Please be advised that RX refills may take up to 3 business days. We ask that you follow-up with your pharmacy.

## 2022-07-28 NOTE — Telephone Encounter (Signed)
Requested Prescriptions  Pending Prescriptions Disp Refills   Semaglutide-Weight Management (WEGOVY) 1 MG/0.5ML SOAJ 2 mL 0    Sig: Inject 1 mg into the skin once a week.     Endocrinology:  Diabetes - GLP-1 Receptor Agonists - semaglutide Failed - 07/28/2022  4:43 PM      Failed - HBA1C in normal range and within 180 days    No results found for: "HGBA1C", "LABA1C"       Passed - Cr in normal range and within 360 days    Creatinine, Ser  Date Value Ref Range Status  07/18/2022 1.02 0.76 - 1.27 mg/dL Final         Passed - Valid encounter within last 6 months    Recent Outpatient Visits           1 week ago Middle ear effusion, bilateral   [facility] Mecum, Erin E, PA-C   1 week ago Obesity (BMI 30.0-34.9)   [facility] Larae Grooms, NP   2 months ago Obesity (BMI 30.0-34.9)   [facility] Larae Grooms, NP   3 months ago Obesity (BMI 30.0-34.9)   [facility] Larae Grooms, NP   5 months ago Obesity (BMI 30.0-34.9)   [facility] Larae Grooms, NP       Future Appointments             In 2 months Larae Grooms, NP [facility]   In 4 months Larae Grooms, NP [facility]

## 2022-07-31 ENCOUNTER — Encounter: Payer: Self-pay | Admitting: Nurse Practitioner

## 2022-07-31 NOTE — Telephone Encounter (Signed)
Pt received wrong dose of Wegovy / he stated he should have been sent the 1.7MG  to the pharmacy / please advise

## 2022-08-01 MED ORDER — OSELTAMIVIR PHOSPHATE 75 MG PO CAPS: 75.0000 mg | ORAL_CAPSULE | Freq: Every day | ORAL | 0 refills | Status: DC

## 2022-10-17 ENCOUNTER — Ambulatory Visit: Payer: BC Managed Care – PPO | Admitting: Nurse Practitioner

## 2022-12-07 ENCOUNTER — Encounter: Payer: BC Managed Care – PPO | Admitting: Nurse Practitioner

## 2023-04-10 ENCOUNTER — Encounter: Payer: Self-pay | Admitting: Family Medicine

## 2023-04-10 ENCOUNTER — Ambulatory Visit (INDEPENDENT_AMBULATORY_CARE_PROVIDER_SITE_OTHER): Payer: BC Managed Care – PPO | Admitting: Family Medicine

## 2023-04-10 VITALS — BP 111/70 | HR 70 | Wt 220.4 lb

## 2023-04-10 DIAGNOSIS — R21 Rash and other nonspecific skin eruption: Secondary | ICD-10-CM | POA: Diagnosis not present

## 2023-04-10 MED ORDER — FLUCONAZOLE 100 MG PO TABS: 100.0000 mg | ORAL_TABLET | Freq: Every day | ORAL | 0 refills | Status: DC

## 2023-04-10 MED ORDER — TRIAMCINOLONE ACETONIDE 40 MG/ML IJ SUSP
40.0000 mg | Freq: Once | INTRAMUSCULAR | Status: AC
  Administered 2023-04-10: 40 mg via INTRAMUSCULAR

## 2023-04-10 MED ORDER — CLOTRIMAZOLE-BETAMETHASONE 1-0.05 % EX CREA: 1.0000 | TOPICAL_CREAM | Freq: Every day | CUTANEOUS | 1 refills | Status: DC

## 2023-04-10 NOTE — Progress Notes (Signed)
BP 111/70   Pulse 70   Wt 220 lb 6.4 oz (100 kg)   SpO2 95%   BMI 34.11 kg/m    Subjective:    Patient ID: David Taylor, male    DOB: October 06, 1965, 57 y.o.   MRN: [id number]  HPI: David Taylor is a 57 y.o. male  Chief Complaint  Patient presents with   Rash    Patient says this is his second visit to discuss a rash underneath his L axilla area. Patient says he has tried Benadryl, Hydrocortisone cream and different antibacterial soaps to help. Patient says it is painful, itches and irritating at times. Patient says the area never completely clear up and says he has been prescribed at his last visit, but did not help that well.    RASH Duration:  months  Location: under both arms  Itching: yes Burning: yes Redness: yes Oozing: no Scaling: no Blisters: no Painful: no Fevers: no Change in detergents/soaps/personal care products: no Recent illness: no Recent travel:no History of same: yes Context: stable Alleviating factors: nothing Treatments attempted:benadryl, lotrisone (helped, but then came back) Shortness of breath: no  Throat/tongue swelling: no Myalgias/arthralgias: no  Relevant past medical, surgical, family and social history reviewed and updated as indicated. Interim medical history since our last visit reviewed. Allergies and medications reviewed and updated.  Review of Systems  Constitutional: Negative.   Respiratory: Negative.    Cardiovascular: Negative.   Gastrointestinal: Negative.   Musculoskeletal: Negative.   Skin:  Positive for rash. Negative for color change, pallor and wound.  Psychiatric/Behavioral: Negative.      Per HPI unless specifically indicated above     Objective:    BP 111/70   Pulse 70   Wt 220 lb 6.4 oz (100 kg)   SpO2 95%   BMI 34.11 kg/m   Wt Readings from Last 3 Encounters:  04/10/23 220 lb 6.4 oz (100 kg)  07/20/22 219 lb 4.8 oz (99.5 kg)  07/18/22 217 lb (98.4 kg)    Physical Exam Vitals and nursing note reviewed.   Constitutional:      General: He is not in acute distress.    Appearance: Normal appearance. He is not ill-appearing, toxic-appearing or diaphoretic.  HENT:     Head: Normocephalic and atraumatic.     Right Ear: External ear normal.     Left Ear: External ear normal.     Nose: Nose normal.     Mouth/Throat:     Mouth: Mucous membranes are moist.     Pharynx: Oropharynx is clear.  Eyes:     General: No scleral icterus.       Right eye: No discharge.        Left eye: No discharge.     Extraocular Movements: Extraocular movements intact.     Conjunctiva/sclera: Conjunctivae normal.     Pupils: Pupils are equal, round, and reactive to light.  Cardiovascular:     Rate and Rhythm: Normal rate and regular rhythm.     Pulses: Normal pulses.     Heart sounds: Normal heart sounds. No murmur heard.    No friction rub. No gallop.  Pulmonary:     Effort: Pulmonary effort is normal. No respiratory distress.     Breath sounds: Normal breath sounds. No stridor. No wheezing, rhonchi or rales.  Chest:     Chest wall: No tenderness.  Musculoskeletal:        General: Normal range of motion.     Cervical back: Normal  range of motion and neck supple.  Skin:    General: Skin is warm and dry.     Capillary Refill: Capillary refill takes less than 2 seconds.     Coloration: Skin is not jaundiced or pale.     Findings: Rash (erythematous, well demarcated rash under both arms) present. No bruising, erythema or lesion.  Neurological:     General: No focal deficit present.     Mental Status: He is alert and oriented to person, place, and time. Mental status is at baseline.  Psychiatric:        Mood and Affect: Mood normal.        Behavior: Behavior normal.        Thought Content: Thought content normal.        Judgment: Judgment normal.     Results for orders placed or performed in visit on 07/18/22  Comp Met (CMET)  Result Value Ref Range   Glucose 103 (H) 70 - 99 mg/dL   BUN 11 6 - 24 mg/dL    Creatinine, Ser 6.29 0.76 - 1.27 mg/dL   eGFR 86 >52 WU/XLK/4.40   BUN/Creatinine Ratio 11 9 - 20   Sodium 140 134 - 144 mmol/L   Potassium 4.7 3.5 - 5.2 mmol/L   Chloride 103 96 - 106 mmol/L   CO2 21 20 - 29 mmol/L   Calcium 9.6 8.7 - 10.2 mg/dL   Total Protein 7.0 6.0 - 8.5 g/dL   Albumin 4.7 3.8 - 4.9 g/dL   Globulin, Total 2.3 1.5 - 4.5 g/dL   Albumin/Globulin Ratio 2.0 1.2 - 2.2   Bilirubin Total 0.4 0.0 - 1.2 mg/dL   Alkaline Phosphatase 89 44 - 121 IU/L   AST 17 0 - 40 IU/L   ALT 20 0 - 44 IU/L  Lipid Profile  Result Value Ref Range   Cholesterol, Total 288 (H) 100 - 199 mg/dL   Triglycerides 102 0 - 149 mg/dL   HDL 57 >72 mg/dL   VLDL Cholesterol Cal 25 5 - 40 mg/dL   LDL Chol Calc (NIH) 536 (H) 0 - 99 mg/dL   Lipid Comment: Comment    Chol/HDL Ratio 5.1 (H) 0.0 - 5.0 ratio      Assessment & Plan:   Problem List Items Addressed This Visit   None Visit Diagnoses     Rash    -  Primary   Will treat with diflucan and triamcinalone. Refill of lotrisone given as well. Call if not getting better or getting worse.   Relevant Medications   triamcinolone acetonide (KENALOG-40) injection 40 mg (Start on 04/10/2023  2:15 PM)        Follow up plan: Return for When Clydie Braun is back for physical.

## 2023-04-11 ENCOUNTER — Telehealth: Payer: Self-pay | Admitting: Nurse Practitioner

## 2023-04-11 MED ORDER — FLUCONAZOLE 100 MG PO TABS: 100.0000 mg | ORAL_TABLET | Freq: Every day | ORAL | 0 refills | Status: AC

## 2023-04-11 MED ORDER — CLOTRIMAZOLE-BETAMETHASONE 1-0.05 % EX CREA: 1.0000 | TOPICAL_CREAM | Freq: Every day | CUTANEOUS | 1 refills | Status: AC

## 2023-04-11 NOTE — Telephone Encounter (Signed)
Pt and his wife are calling in because pt's medication [facility] and [facility] is currently out [facility]. Pt wants to know if he can have the medication sent over to [facility] on [address] in [place]. fluconazole (DIFLUCAN) 100 MG tablet [[id number]] clotrimazole-betamethasone (LOTRISONE) cream [[id number]]

## 2023-05-02 DIAGNOSIS — M5413 Radiculopathy, cervicothoracic region: Secondary | ICD-10-CM | POA: Diagnosis not present

## 2023-05-02 DIAGNOSIS — M9901 Segmental and somatic dysfunction of cervical region: Secondary | ICD-10-CM | POA: Diagnosis not present

## 2023-05-02 DIAGNOSIS — M9904 Segmental and somatic dysfunction of sacral region: Secondary | ICD-10-CM | POA: Diagnosis not present

## 2023-05-02 DIAGNOSIS — M9903 Segmental and somatic dysfunction of lumbar region: Secondary | ICD-10-CM | POA: Diagnosis not present

## 2023-06-04 ENCOUNTER — Encounter: Payer: Self-pay | Admitting: Family Medicine

## 2023-06-04 ENCOUNTER — Ambulatory Visit: Payer: BC Managed Care – PPO | Admitting: Family Medicine

## 2023-06-04 VITALS — BP 115/73 | HR 94 | Temp 98.3°F | Ht 67.32 in | Wt 217.8 lb

## 2023-06-04 DIAGNOSIS — E66811 Obesity, class 1: Secondary | ICD-10-CM

## 2023-06-04 DIAGNOSIS — W57XXXA Bitten or stung by nonvenomous insect and other nonvenomous arthropods, initial encounter: Secondary | ICD-10-CM

## 2023-06-04 DIAGNOSIS — M79605 Pain in left leg: Secondary | ICD-10-CM

## 2023-06-04 DIAGNOSIS — S70262A Insect bite (nonvenomous), left hip, initial encounter: Secondary | ICD-10-CM

## 2023-06-04 DIAGNOSIS — R21 Rash and other nonspecific skin eruption: Secondary | ICD-10-CM

## 2023-06-04 MED ORDER — WEGOVY 0.5 MG/0.5ML ~~LOC~~ SOAJ: 0.5000 mg | SUBCUTANEOUS | 0 refills | Status: DC

## 2023-06-04 MED ORDER — WEGOVY 0.25 MG/0.5ML ~~LOC~~ SOAJ: 0.2500 mg | SUBCUTANEOUS | 0 refills | Status: DC

## 2023-06-04 MED ORDER — CYCLOBENZAPRINE HCL 5 MG PO TABS: 5.0000 mg | ORAL_TABLET | Freq: Three times a day (TID) | ORAL | 1 refills | Status: AC | PRN

## 2023-06-04 NOTE — Progress Notes (Unsigned)
BP 115/73   Pulse 94   Temp 98.3 F (36.8 C) (Oral)   Ht 5' 7.32" (1.71 m)   Wt 217 lb 12.8 oz (98.8 kg)   SpO2 97%   BMI 33.79 kg/m    Subjective:    Patient ID: David Taylor, male    DOB: Dec 28, 1965, 57 y.o.   MRN: [id number]  HPI: David Taylor is a 57 y.o. male  Chief Complaint  Patient presents with   Leg Pain   Weight Loss   Noticed a 1-2 mm size lesion on his left hip, 2-3 days ago. Lesion appears to have scabbing and in healing stages with redness No signs of infection. Yesterday left leg was painful and experienced some numbness and tingling. He is normal outside, unsure what could have caused it. "Tinglingness at times, feels like nerve pain, tried aspirin yesterday" mu  LEG PAIN Better today than it was yesterday.  Duration: days Pain: yes but not excruciating Severity: 9/10 yesterday Quality:  cramping in knee and thigh Location:   left upper leg and lower leg Bilateral:  no Onset: gradual Frequency:  Comes and goes Sudden unintentional leg jerking:   no Paresthesias:   no Decreased sensation:  no Weakness:   no Insomnia:   no Fatigue:   no Alleviating factors: Sleep Aggravating factors:  worsened with standing and did a lot of standing Status: better Treatments attempted: Yesterday: took aspirin.   WEIGHT GAIN [employer]. BMI : 33.79. Weight is 217 lb. Goal weight is 180 lbs, weight loss of 40 lbs.  Diet consist of x2 meals daily. Does admit to sugar intake of sodas.  Exercise consist of 1 hour daily, biking, weight lifting, skating. Duration:  Previous attempts at weight loss: yes used Wegovy 1 year ago.  Complications of obesity:  Peak weight: 220lbs   Weight loss goal:  Weight loss to date:  Requesting obesity pharmacotherapy: yes Current weight loss supplements/medications: no Previous weight loss supplements/meds: no Calories:     Relevant past medical, surgical, family and social history reviewed and updated as indicated.  Interim medical history since our last visit reviewed. Allergies and medications reviewed and updated.  Review of Systems  Respiratory: Negative.    Cardiovascular: Negative.   Skin:  Positive for wound.  Neurological:  Positive for numbness.    Per HPI unless specifically indicated above     Objective:    BP 115/73   Pulse 94   Temp 98.3 F (36.8 C) (Oral)   Ht 5' 7.32" (1.71 m)   Wt 217 lb 12.8 oz (98.8 kg)   SpO2 97%   BMI 33.79 kg/m   Wt Readings from Last 3 Encounters:  06/04/23 217 lb 12.8 oz (98.8 kg)  04/10/23 220 lb 6.4 oz (100 kg)  07/20/22 219 lb 4.8 oz (99.5 kg)    Physical Exam Vitals and nursing note reviewed.  Constitutional:      General: He is awake. He is not in acute distress.    Appearance: Normal appearance. He is well-developed and well-groomed. He is not ill-appearing.  HENT:     Head: Normocephalic and atraumatic.     Right Ear: Hearing and external ear normal. No drainage.     Left Ear: Hearing and external ear normal. No drainage.     Nose: Nose normal.  Eyes:     General: Lids are normal.        Right eye: No discharge.        Left  eye: No discharge.     Conjunctiva/sclera: Conjunctivae normal.  Cardiovascular:     Rate and Rhythm: Normal rate and regular rhythm.     Heart sounds: Normal heart sounds, S1 normal and S2 normal. No murmur heard.    No gallop.  Pulmonary:     Effort: Pulmonary effort is normal. No accessory muscle usage or respiratory distress.     Breath sounds: Normal breath sounds.  Musculoskeletal:        General: Normal range of motion.     Cervical back: Full passive range of motion without pain and normal range of motion.     Right lower leg: No edema.     Left lower leg: No edema.  Skin:    General: Skin is warm and dry.     Capillary Refill: Capillary refill takes less than 2 seconds.  Neurological:     Mental Status: He is alert and oriented to person, place, and time.  Psychiatric:        Attention and  Perception: Attention normal.        Mood and Affect: Mood normal.        Speech: Speech normal.        Behavior: Behavior normal. Behavior is cooperative.        Thought Content: Thought content normal.     Results for orders placed or performed in visit on 07/18/22  Comp Met (CMET)  Result Value Ref Range   Glucose 103 (H) 70 - 99 mg/dL   BUN 11 6 - 24 mg/dL   Creatinine, Ser 8.46 0.76 - 1.27 mg/dL   eGFR 86 >96 EX/BMW/4.13   BUN/Creatinine Ratio 11 9 - 20   Sodium 140 134 - 144 mmol/L   Potassium 4.7 3.5 - 5.2 mmol/L   Chloride 103 96 - 106 mmol/L   CO2 21 20 - 29 mmol/L   Calcium 9.6 8.7 - 10.2 mg/dL   Total Protein 7.0 6.0 - 8.5 g/dL   Albumin 4.7 3.8 - 4.9 g/dL   Globulin, Total 2.3 1.5 - 4.5 g/dL   Albumin/Globulin Ratio 2.0 1.2 - 2.2   Bilirubin Total 0.4 0.0 - 1.2 mg/dL   Alkaline Phosphatase 89 44 - 121 IU/L   AST 17 0 - 40 IU/L   ALT 20 0 - 44 IU/L  Lipid Profile  Result Value Ref Range   Cholesterol, Total 288 (H) 100 - 199 mg/dL   Triglycerides 244 0 - 149 mg/dL   HDL 57 >01 mg/dL   VLDL Cholesterol Cal 25 5 - 40 mg/dL   LDL Chol Calc (NIH) 027 (H) 0 - 99 mg/dL   Lipid Comment: Comment    Chol/HDL Ratio 5.1 (H) 0.0 - 5.0 ratio      Assessment & Plan:   Problem List Items Addressed This Visit     Obesity (BMI 30.0-34.9) - Primary   Other Visit Diagnoses     Rash       Relevant Orders   Spotted Fever Group Antibodies   Lyme Disease Serology w/Reflex   Human Granulocytic Ehrlich-HGE   Leg pain, diffuse, left            Follow up plan: Return in about 2 months (around 08/04/2023) for Follow up weight.

## 2023-06-05 DIAGNOSIS — M79605 Pain in left leg: Secondary | ICD-10-CM | POA: Insufficient documentation

## 2023-06-05 DIAGNOSIS — R21 Rash and other nonspecific skin eruption: Secondary | ICD-10-CM | POA: Insufficient documentation

## 2023-06-05 MED ORDER — WEGOVY 0.5 MG/0.5ML ~~LOC~~ SOAJ: 0.5000 mg | SUBCUTANEOUS | 0 refills | Status: AC

## 2023-06-05 MED ORDER — WEGOVY 0.25 MG/0.5ML ~~LOC~~ SOAJ: 0.2500 mg | SUBCUTANEOUS | 0 refills | Status: AC

## 2023-06-05 NOTE — Assessment & Plan Note (Signed)
Acute, stable. Tick labs ordered, will treat based on results.

## 2023-06-05 NOTE — Addendum Note (Signed)
Addended by: Prescott Gum on: 06/05/2023 08:53 AM   Modules accepted: Orders

## 2023-06-05 NOTE — Assessment & Plan Note (Addendum)
Acute, stable. Flexeril 5 MG TID given to help with relief. Recommend alternate with Tylenol and Ibuprofen. Will consider imaging if pain does not improve in 1 month.

## 2023-06-05 NOTE — Assessment & Plan Note (Signed)
Chronic, ongoing. Will start Wegovy 0.25 MG weekly for 1 month and increase to 0.5 MG weekly for 1 month. Educated on the importance of combination therapy for weight loss goal consisting of exercise of at least 150 mins weekly, x3 balanced meals daily, educated to choose foods with low fat and low sugar content, and increased water intake to at least 80-90 ounces daily. Return in 2 months for follow up, call sooner if concerns arise.

## 2023-06-06 LAB — HUMAN GRANULOCYTIC EHRLICH-HGE
HGE IgG Titer: NEGATIVE
HGE IgM Titer: NEGATIVE

## 2023-06-06 LAB — SPOTTED FEVER GROUP ANTIBODIES
Spotted Fever Group IgG: <1:64 {titer}
Spotted Fever Group IgM: <1:64 {titer}

## 2023-06-06 LAB — LYME DISEASE SEROLOGY W/REFLEX: Lyme Total Antibody EIA: NEGATIVE

## 2023-06-06 NOTE — Progress Notes (Signed)
Hi Devoe, your tick labs have returned as negative. Thank you for allowing me to participate in your care.

## 2023-06-11 ENCOUNTER — Telehealth: Payer: Self-pay

## 2023-06-11 ENCOUNTER — Encounter: Payer: Self-pay | Admitting: Family Medicine

## 2023-06-11 NOTE — Telephone Encounter (Signed)
PA for [facility] initiated and submitted via Cover My Meds. Key: [id number]

## 2023-06-13 NOTE — Telephone Encounter (Signed)
PA approved. Patient notified via Mychart message. ?

## 2023-06-20 ENCOUNTER — Telehealth: Payer: BC Managed Care – PPO | Admitting: Nurse Practitioner

## 2023-08-05 DIAGNOSIS — H9313 Tinnitus, bilateral: Secondary | ICD-10-CM | POA: Diagnosis not present

## 2023-08-05 DIAGNOSIS — H65193 Other acute nonsuppurative otitis media, bilateral: Secondary | ICD-10-CM | POA: Diagnosis not present

## 2024-02-28 DIAGNOSIS — C44612 Basal cell carcinoma of skin of right upper limb, including shoulder: Secondary | ICD-10-CM | POA: Diagnosis not present

## 2024-02-28 DIAGNOSIS — C44519 Basal cell carcinoma of skin of other part of trunk: Secondary | ICD-10-CM | POA: Diagnosis not present

## 2024-02-28 DIAGNOSIS — L570 Actinic keratosis: Secondary | ICD-10-CM | POA: Diagnosis not present

## 2024-02-28 DIAGNOSIS — C44619 Basal cell carcinoma of skin of left upper limb, including shoulder: Secondary | ICD-10-CM | POA: Diagnosis not present

## 2024-02-28 DIAGNOSIS — D2262 Melanocytic nevi of left upper limb, including shoulder: Secondary | ICD-10-CM | POA: Diagnosis not present

## 2024-02-28 DIAGNOSIS — D2261 Melanocytic nevi of right upper limb, including shoulder: Secondary | ICD-10-CM | POA: Diagnosis not present

## 2024-02-28 DIAGNOSIS — D225 Melanocytic nevi of trunk: Secondary | ICD-10-CM | POA: Diagnosis not present

## 2024-02-28 DIAGNOSIS — D485 Neoplasm of uncertain behavior of skin: Secondary | ICD-10-CM | POA: Diagnosis not present

## 2024-04-20 DIAGNOSIS — R0981 Nasal congestion: Secondary | ICD-10-CM | POA: Diagnosis not present

## 2024-04-20 DIAGNOSIS — R051 Acute cough: Secondary | ICD-10-CM | POA: Diagnosis not present

## 2024-04-20 DIAGNOSIS — J3489 Other specified disorders of nose and nasal sinuses: Secondary | ICD-10-CM | POA: Diagnosis not present

## 2024-04-20 DIAGNOSIS — U071 COVID-19: Secondary | ICD-10-CM | POA: Diagnosis not present

## 2024-06-11 ENCOUNTER — Ambulatory Visit: Payer: Self-pay

## 2024-06-17 ENCOUNTER — Encounter: Payer: Self-pay | Admitting: Nurse Practitioner

## 2024-07-08 DIAGNOSIS — J018 Other acute sinusitis: Secondary | ICD-10-CM | POA: Diagnosis not present
